# Patient Record
Sex: Female | Born: 1965 | Race: White | Hispanic: No | Marital: Married | Smoking: Current every day smoker
Health system: Southern US, Community
[De-identification: ages and names within clinical notes are randomized; demographics above are authoritative.]

## PROBLEM LIST (undated history)

## (undated) DIAGNOSIS — T7840XA Allergy, unspecified, initial encounter: Secondary | ICD-10-CM

## (undated) DIAGNOSIS — R7303 Prediabetes: Secondary | ICD-10-CM

## (undated) DIAGNOSIS — M1990 Unspecified osteoarthritis, unspecified site: Secondary | ICD-10-CM

## (undated) HISTORY — DX: Unspecified osteoarthritis, unspecified site: M19.90

## (undated) HISTORY — DX: Prediabetes: R73.03

## (undated) HISTORY — DX: Allergy, unspecified, initial encounter: T78.40XA

---
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
## 1998-04-20 ENCOUNTER — Other Ambulatory Visit: Admission: RE | Admit: 1998-04-20 | Discharge: 1998-04-20 | Payer: Self-pay | Admitting: Obstetrics and Gynecology

## 1999-05-05 ENCOUNTER — Other Ambulatory Visit: Admission: RE | Admit: 1999-05-05 | Discharge: 1999-05-05 | Payer: Self-pay | Admitting: Obstetrics and Gynecology

## 2000-05-03 ENCOUNTER — Other Ambulatory Visit: Admission: RE | Admit: 2000-05-03 | Discharge: 2000-05-03 | Payer: Self-pay | Admitting: Obstetrics and Gynecology

## 2000-06-01 ENCOUNTER — Ambulatory Visit (HOSPITAL_COMMUNITY): Admission: RE | Admit: 2000-06-01 | Discharge: 2000-06-01 | Payer: Self-pay | Admitting: Obstetrics and Gynecology

## 2001-05-30 ENCOUNTER — Other Ambulatory Visit: Admission: RE | Admit: 2001-05-30 | Discharge: 2001-05-30 | Payer: Self-pay | Admitting: Obstetrics and Gynecology

## 2002-06-30 ENCOUNTER — Other Ambulatory Visit: Admission: RE | Admit: 2002-06-30 | Discharge: 2002-06-30 | Payer: Self-pay | Admitting: Obstetrics and Gynecology

## 2003-07-10 ENCOUNTER — Other Ambulatory Visit: Admission: RE | Admit: 2003-07-10 | Discharge: 2003-07-10 | Payer: Self-pay | Admitting: Obstetrics and Gynecology

## 2004-10-27 ENCOUNTER — Other Ambulatory Visit: Admission: RE | Admit: 2004-10-27 | Discharge: 2004-10-27 | Payer: Self-pay | Admitting: Obstetrics and Gynecology

## 2005-01-17 ENCOUNTER — Encounter: Admission: RE | Admit: 2005-01-17 | Discharge: 2005-01-17 | Payer: Self-pay | Admitting: Obstetrics and Gynecology

## 2005-02-20 ENCOUNTER — Encounter: Admission: RE | Admit: 2005-02-20 | Discharge: 2005-02-20 | Payer: Self-pay | Admitting: Obstetrics and Gynecology

## 2005-11-10 ENCOUNTER — Other Ambulatory Visit: Admission: RE | Admit: 2005-11-10 | Discharge: 2005-11-10 | Payer: Self-pay | Admitting: Obstetrics and Gynecology

## 2007-11-04 ENCOUNTER — Encounter: Admission: RE | Admit: 2007-11-04 | Discharge: 2007-11-04 | Payer: Self-pay | Admitting: Otolaryngology

## 2007-12-18 ENCOUNTER — Encounter: Admission: RE | Admit: 2007-12-18 | Discharge: 2007-12-18 | Payer: Self-pay | Admitting: Obstetrics and Gynecology

## 2008-12-22 ENCOUNTER — Encounter: Admission: RE | Admit: 2008-12-22 | Discharge: 2008-12-22 | Payer: Self-pay | Admitting: Obstetrics and Gynecology

## 2009-12-23 ENCOUNTER — Encounter: Admission: RE | Admit: 2009-12-23 | Discharge: 2009-12-23 | Payer: Self-pay | Admitting: Obstetrics and Gynecology

## 2009-12-28 ENCOUNTER — Encounter: Admission: RE | Admit: 2009-12-28 | Discharge: 2009-12-28 | Payer: Self-pay | Admitting: Obstetrics and Gynecology

## 2010-10-30 ENCOUNTER — Encounter: Payer: Self-pay | Admitting: Obstetrics and Gynecology

## 2010-12-12 ENCOUNTER — Other Ambulatory Visit: Payer: Self-pay | Admitting: Obstetrics and Gynecology

## 2010-12-12 DIAGNOSIS — Z1231 Encounter for screening mammogram for malignant neoplasm of breast: Secondary | ICD-10-CM

## 2011-01-02 ENCOUNTER — Ambulatory Visit
Admission: RE | Admit: 2011-01-02 | Discharge: 2011-01-02 | Disposition: A | Discharge status: Home / Self Care | Payer: BC Managed Care – PPO | Source: Ambulatory Visit | Attending: Obstetrics and Gynecology | Admitting: Obstetrics and Gynecology

## 2011-01-02 DIAGNOSIS — Z1231 Encounter for screening mammogram for malignant neoplasm of breast: Secondary | ICD-10-CM

## 2011-02-24 NOTE — H&P (Signed)
[facility]  Patient:    Casey Barton, Casey Barton                       MRN: [id number] Adm. Date:  06/01/00 Attending:  Duke Salvia. Marcelle Overlie, M.D.                         History and Physical  CHIEF COMPLAINT:                  Request for permanent sterilization.  HISTORY OF PRESENT ILLNESS:       The patient is a 45 year old G1 P1, currently on Levlite, previously on Depo-Provera, presenting for permanent sterilization.  This procedure including permanence, failure rate of 2-12/998, and other risks regarding bleeding, infection, adjacent organ injury, possible need for open or additional surgery were all discussed with her, which she understands and accepts.  ALLERGIES:                        No known drug allergies.  PAST SURGICAL HISTORY:            None.  REVIEW OF SYSTEMS:                She had a fractured knee.  One vaginal delivery in 1990, a boy.  SOCIAL HISTORY:                   She is still smoking 1/2 pack of cigarettes per day.  PHYSICAL EXAMINATION:  VITAL SIGNS:                      Temperature 98.2 degrees, blood pressure 120/82.  HEENT:                            Unremarkable.  NECK:                             Supple without masses.  LUNGS:                            Clear.  CARDIOVASCULAR:                   Regular rate and rhythm without murmurs, rubs, or gallops.  BREAST:                           Without masses.  ABDOMEN:                          Soft, flat, nontender.  PELVIC:                           Vulva, vagina, and cervix normal.  Uterus mid position, nontender.  Adnexa negative.  EXTREMITIES:                      Negative.  NEUROLOGIC:                       Unremarkable.  LABORATORY DATA:                  Last Pap smear in July 2001 was class  1.  IMPRESSION:                       Normal examination; patient requests permanent sterilization.  PLAN:                             Laparoscopic tubal by Filshie  clip application.  The procedure and risks were discussed as noted above. DD: 05/29/00 TD:  05/30/00 Job: [id number]

## 2011-02-24 NOTE — Op Note (Signed)
[facility]  Patient:    Casey Barton, Casey Barton                       MRN: [id number] Proc. Date: 06/01/00 Attending:  Duke Salvia. Marcelle Overlie, M.D.                           Operative Report  PREOPERATIVE DIAGNOSIS:       Requests permanent sterilization.  POSTOPERATIVE DIAGNOSIS:      1. Requests permanent sterilization.                               2. 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  OPERATIONS:                   1. Diagnostic laparoscopy.                               2. Bilateral tubal ligation by Filshie clip                                  application.  SURGEON:                      Duke Salvia. Marcelle Overlie, M.D.  ANESTHESIA:                   General endotracheal.  COMPLICATIONS:                None.  DRAINS:                       In and out Foley catheter.  ESTIMATED BLOOD LOSS:         Less than 5 cc.  FINDINGS AND PROCEDURE:       The patient was brought to the operating room. After an adequate level of general endotracheal anesthesia was obtained and both legs in the stirrups the abdomen, perineum and vagina were prepped and draped in the usual manner for laparoscopy.  The bladder was drained.  EUA was carried.  Uterus was mid to slightly posterior and normal size. Adnexa negative.  Hulka tenaculum was positioned.  Attention was directed to the abdomen where a 2 cm subumbilical incision was made.  The Veress needle was introduced without difficulty.  Its intra-abdominal position was identified by pressure and water testing.  Prior to this the subumbilical area had been infiltrated with half percent Marcaine plain.  After a 2-liter pneumoperitoneum was then carried out the laparoscopic trocar and sheath were then introduced.  There was no evidence of any bleeding or trauma.  The patient was then placed in Trendelenburg and with the uterus anteflexed the pelvic findings were as follows.  Right  tube and ovary were normal.  The cul-de-sac was clear, except for where the left ovary was densely adherent to the area of the left uterosacral ligament and slightly lateral.  These were extremely dense adhesions and could not be lysed, but would require an open surgical technique in my opinion.  The left  tube, however, appeared to be normal and was not involved in the adherence of the left ovary.  No obvious areas of endometriosis.  The appendix and anterior cul-de-sac were otherwise normal as was the uterine serosa.  After these findings were noted half percent Marcaine plain was then dripped across the tube from the cornu to fimbriated end on each side.  Starting on the left the left tube was identified to the fimbriated end.  Once positive identification was made it was regrasped 2 cm from the cornu at a right angle and a Filshie clip was applied completely engulfing the tube with excellent application.  The same was repeated on the right side after carefully identifying the tube.  These areas were hemostatic prior to closure.  Sponge, needle and instrument counts were correct x 2. The incision was closed with a 3-0 Dexon subcuticular suture. She tolerated this well and went to recovery room in good condition. DD:  06/01/00 TD:  06/03/00 Job: [id number]

## 2011-06-13 ENCOUNTER — Other Ambulatory Visit: Payer: Self-pay | Admitting: Obstetrics and Gynecology

## 2011-06-13 DIAGNOSIS — N6320 Unspecified lump in the left breast, unspecified quadrant: Secondary | ICD-10-CM

## 2011-06-16 ENCOUNTER — Other Ambulatory Visit: Payer: BC Managed Care – PPO

## 2011-06-16 ENCOUNTER — Ambulatory Visit
Admission: RE | Admit: 2011-06-16 | Discharge: 2011-06-16 | Disposition: A | Discharge status: Home / Self Care | Payer: BC Managed Care – PPO | Source: Ambulatory Visit | Attending: Obstetrics and Gynecology | Admitting: Obstetrics and Gynecology

## 2011-06-16 ENCOUNTER — Other Ambulatory Visit: Payer: Self-pay | Admitting: Obstetrics and Gynecology

## 2011-06-16 DIAGNOSIS — N6320 Unspecified lump in the left breast, unspecified quadrant: Secondary | ICD-10-CM

## 2012-06-03 ENCOUNTER — Other Ambulatory Visit: Payer: Self-pay | Admitting: Obstetrics and Gynecology

## 2012-06-03 DIAGNOSIS — Z1231 Encounter for screening mammogram for malignant neoplasm of breast: Secondary | ICD-10-CM

## 2012-06-24 ENCOUNTER — Ambulatory Visit: Payer: BC Managed Care – PPO

## 2012-11-13 ENCOUNTER — Other Ambulatory Visit: Payer: Self-pay | Admitting: Family Medicine

## 2012-11-13 ENCOUNTER — Other Ambulatory Visit (HOSPITAL_COMMUNITY)
Admission: RE | Admit: 2012-11-13 | Discharge: 2012-11-13 | Disposition: A | Discharge status: Home / Self Care | Payer: BC Managed Care – PPO | Source: Ambulatory Visit | Attending: Family Medicine | Admitting: Family Medicine

## 2012-11-13 DIAGNOSIS — Z124 Encounter for screening for malignant neoplasm of cervix: Secondary | ICD-10-CM | POA: Insufficient documentation

## 2013-02-24 ENCOUNTER — Ambulatory Visit
Admission: RE | Admit: 2013-02-24 | Discharge: 2013-02-24 | Disposition: A | Discharge status: Home / Self Care | Payer: BC Managed Care – PPO | Source: Ambulatory Visit | Attending: Obstetrics and Gynecology | Admitting: Obstetrics and Gynecology

## 2013-02-24 DIAGNOSIS — Z1231 Encounter for screening mammogram for malignant neoplasm of breast: Secondary | ICD-10-CM

## 2013-04-16 ENCOUNTER — Ambulatory Visit (INDEPENDENT_AMBULATORY_CARE_PROVIDER_SITE_OTHER): Payer: PRIVATE HEALTH INSURANCE | Admitting: Family Medicine

## 2013-04-16 VITALS — BP 130/90 | HR 85 | Temp 98.0°F | Resp 16 | Ht 65.25 in | Wt 187.8 lb

## 2013-04-16 DIAGNOSIS — L03119 Cellulitis of unspecified part of limb: Secondary | ICD-10-CM

## 2013-04-16 DIAGNOSIS — IMO0002 Reserved for concepts with insufficient information to code with codable children: Secondary | ICD-10-CM

## 2013-04-16 MED ORDER — CEPHALEXIN 500 MG PO CAPS: 1000.0000 mg | ORAL_CAPSULE | Freq: Two times a day (BID) | ORAL | Status: DC

## 2013-04-16 NOTE — Progress Notes (Signed)
[facility] [address], [place] [address] [phone]  Date:  04/16/2013   Name:  Casey Barton   DOB:  [birth year]   MRN:  [id number]  PCP:  Gaye Alken, MD    Chief Complaint: left arm rash, painful x 5 days   History of Present Illness:  Casey Barton is a 46 y.o. very pleasant female patient who presents with the following:  She has noted a rash on her left arm for the last 5 days or so.  It does not seem to be getting worse.  The rash is tender, not itchy.   She does not have any other rashes.   She did have a breast bx 2 years ago, but this was benign.    She also notes some build-up in her ears.  They itch, and her hearing is not always good.    No fever or chills, she feels ok otherwise.  No other concerns today  There are no active problems to display for this patient.   Past Medical History  Diagnosis Date  . Allergy   . Arthritis     History reviewed. No pertinent past surgical history.  History  Substance Use Topics  . Smoking status: Current Every Day Smoker  . Smokeless tobacco: Not on file  . Alcohol Use: Yes    Family History  Problem Relation Age of Onset  . Heart disease Father     Not on File  Medication list has been reviewed and updated.  No current outpatient prescriptions on file prior to visit.   No current facility-administered medications on file prior to visit.    Review of Systems:  As per HPI- otherwise negative.   Physical Examination: Filed Vitals:   04/16/13 0833  BP: 100/62  Pulse: 85  Temp: 98 F (36.7 C)  Resp: 16   Filed Vitals:   04/16/13 0833  Height: 5' 5.25" (1.657 m)  Weight: 187 lb 12.8 oz (85.186 kg)   Body mass index is 31.03 kg/(m^2). Ideal Body Weight: Weight in (lb) to have BMI = 25: 151.1  GEN: WDWN, NAD, Non-toxic, A & O x 3, looks well HEENT: Atraumatic, Normocephalic. Neck supple. No masses, No LAD.  Bilateral TM wnl, oropharynx normal.   PEERL,EOMI.  Some dry skin in outer ear canals, ears otherwise normal  Ears and Nose: No external deformity. CV: RRR, No M/G/R. No JVD. No thrill. No extra heart sounds. PULM: CTA B, no wheezes, crackles, rhonchi. No retractions. No resp. distress. No accessory muscle use. ABD: S, NT, ND, +BS. No rebound. No HSM. EXTR: No c/c/e NEURO Normal gait.  PSYCH: Normally interactive. Conversant. Not depressed or anxious appearing.  Calm demeanor.  Skin: there is a small scratch with surrounding redness just distal to her elbow on the dorsal forearm.  This area is tender  The forarm near the wrist displays a flat, slightly red, scattered rash The elbow and wrist joints are negative with full, painless ROM.  Hand with normal strength and cap refill   Assessment and Plan: Cellulitis of elbow - Plan: cephALEXin (KEFLEX) 500 MG capsule  Possible cellulitis of soft tissue near elbow.  Treat with keflex.  Close follow-up if not better- Sooner if worse.     Signed Abbe Amsterdam, MD

## 2013-04-16 NOTE — Patient Instructions (Addendum)
Use the antibiotic as directed.  If you are not better in the next few days please let me know- Sooner if worse.

## 2013-05-20 ENCOUNTER — Ambulatory Visit (INDEPENDENT_AMBULATORY_CARE_PROVIDER_SITE_OTHER): Payer: PRIVATE HEALTH INSURANCE | Admitting: Emergency Medicine

## 2013-05-20 VITALS — BP 108/74 | HR 80 | Temp 98.1°F | Resp 16 | Ht 65.5 in | Wt 189.0 lb

## 2013-05-20 DIAGNOSIS — L259 Unspecified contact dermatitis, unspecified cause: Secondary | ICD-10-CM

## 2013-05-20 DIAGNOSIS — L309 Dermatitis, unspecified: Secondary | ICD-10-CM

## 2013-05-20 DIAGNOSIS — H60332 Swimmer's ear, left ear: Secondary | ICD-10-CM

## 2013-05-20 DIAGNOSIS — H60399 Other infective otitis externa, unspecified ear: Secondary | ICD-10-CM

## 2013-05-20 MED ORDER — CIPROFLOXACIN-HYDROCORTISONE 0.2-1 % OT SUSP: 3.0000 [drp] | Freq: Two times a day (BID) | OTIC | Status: DC

## 2013-05-20 MED ORDER — TRIAMCINOLONE ACETONIDE 0.1 % EX CREA: TOPICAL_CREAM | Freq: Two times a day (BID) | CUTANEOUS | Status: DC

## 2013-05-20 NOTE — Patient Instructions (Addendum)

## 2013-05-20 NOTE — Progress Notes (Signed)
[facility] [address], [place] [address] [phone]  Date:  05/20/2013   Name:  Casey Barton   DOB:  June 08, 1966   MRN:  [id number]  PCP:  Gaye Alken, MD    Chief Complaint: Otalgia and Rash   History of Present Illness:  Casey Barton is a 47 y.o. very pleasant female patient who presents with the following:  Has multiple concerns.  Was treated with keflex for cellulitis and has improved.  Has continued erythematous rash on both forearms that has not improved.  Has put benadryl on it with no change. Has itching in ears after using qtips.  No drainage or pain.  No improvement with over the counter medications or other home remedies. Denies other complaint or health concern today.   There are no active problems to display for this patient.   Past Medical History  Diagnosis Date  . Allergy   . Arthritis     History reviewed. No pertinent past surgical history.  History  Substance Use Topics  . Smoking status: Current Every Day Smoker  . Smokeless tobacco: Not on file  . Alcohol Use: Yes    Family History  Problem Relation Age of Onset  . Heart disease Father     Allergies  Allergen Reactions  . Sulfa Antibiotics     SOB    Medication list has been reviewed and updated.  Current Outpatient Prescriptions on File Prior to Visit  Medication Sig Dispense Refill  . cephALEXin (KEFLEX) 500 MG capsule Take 2 capsules (1,000 mg total) by mouth 2 (two) times daily.  28 capsule  0   No current facility-administered medications on file prior to visit.    Review of Systems:  As per HPI, otherwise negative.    Physical Examination: Filed Vitals:   05/20/13 0842  BP: 108/74  Pulse: 80  Temp: 98.1 F (36.7 C)  Resp: 16   Filed Vitals:   05/20/13 0842  Height: 5' 5.5" (1.664 m)  Weight: 189 lb (85.73 kg)   Body mass index is 30.96 kg/(m^2). Ideal Body Weight: Weight in (lb) to have BMI = 25: 152.2   GEN:  WDWN, NAD, Non-toxic, Alert & Oriented x 3 HEENT: Atraumatic, Normocephalic.  Ears and Nose: No external deformity.  Erythematous external ear canal on left. EXTR: No clubbing/cyanosis/edema NEURO: Normal gait.  PSYCH: Normally interactive. Conversant. Not depressed or anxious appearing.  Calm demeanor.  SKIN:  Eczema forearms.  Assessment and Plan: Eczema External otitis Resolved cellulitis TAC cipro hc  Signed,  Phillips Odor, MD

## 2013-08-01 ENCOUNTER — Ambulatory Visit: Payer: PRIVATE HEALTH INSURANCE | Admitting: Family Medicine

## 2014-01-01 ENCOUNTER — Ambulatory Visit (INDEPENDENT_AMBULATORY_CARE_PROVIDER_SITE_OTHER): Payer: PRIVATE HEALTH INSURANCE | Admitting: Physician Assistant

## 2014-01-01 VITALS — BP 120/78 | HR 83 | Temp 98.1°F | Resp 18 | Ht 66.5 in | Wt 192.0 lb

## 2014-01-01 DIAGNOSIS — J309 Allergic rhinitis, unspecified: Secondary | ICD-10-CM

## 2014-01-01 DIAGNOSIS — R51 Headache: Secondary | ICD-10-CM

## 2014-01-01 DIAGNOSIS — J069 Acute upper respiratory infection, unspecified: Secondary | ICD-10-CM

## 2014-01-01 DIAGNOSIS — M542 Cervicalgia: Secondary | ICD-10-CM

## 2014-01-01 DIAGNOSIS — B9789 Other viral agents as the cause of diseases classified elsewhere: Secondary | ICD-10-CM

## 2014-01-01 MED ORDER — GUAIFENESIN ER 1200 MG PO TB12: 1.0000 | ORAL_TABLET | Freq: Two times a day (BID) | ORAL | Status: DC | PRN

## 2014-01-01 MED ORDER — PSEUDOEPHEDRINE HCL 60 MG PO TABS: 60.0000 mg | ORAL_TABLET | Freq: Four times a day (QID) | ORAL | Status: DC | PRN

## 2014-01-01 MED ORDER — FLUTICASONE PROPIONATE 50 MCG/ACT NA SUSP: 2.0000 | Freq: Every day | NASAL | Status: DC

## 2014-01-01 MED ORDER — KETOROLAC TROMETHAMINE 60 MG/2ML IM SOLN
60.0000 mg | Freq: Once | INTRAMUSCULAR | Status: AC
  Administered 2014-01-01: 60 mg via INTRAMUSCULAR

## 2014-01-01 MED ORDER — BENZONATATE 100 MG PO CAPS: 100.0000 mg | ORAL_CAPSULE | Freq: Three times a day (TID) | ORAL | Status: DC | PRN

## 2014-01-01 MED ORDER — CYCLOBENZAPRINE HCL 10 MG PO TABS: 10.0000 mg | ORAL_TABLET | Freq: Three times a day (TID) | ORAL | Status: DC | PRN

## 2014-01-01 NOTE — Progress Notes (Signed)
Subjective:    Patient ID: Casey Barton, female    DOB: 01/03/1966, 48 y.o.   MRN: [id number]  HPI   Casey Barton is a pleasant 48 yr old female here with complain of 3 days of headache and head pressure.  Has pain at the base of her skull down to her upper back - worse on the left than on the right.  Throbbing pain.  Rates this pain as 10/10.  Started insidiously 3 days ago - no known injury.  She does not typically get headaches.  Pain has not been responsive to ibuprofen but is relieved when she goes to sleep at night.  Additionally she has sinus congestion, sneezing, non prod cough, wheezing and SOB. "Everything aches."  She has not had fever.  Has been taking otc sudafed without relief.  She does have seasonal allergies - took claritin for 2 days but did not seem to be helping.  No known sick contacts.  She is a current every day smoker - but is "barely" smoking due to illness   Review of Systems  Constitutional: Negative for fever and chills.  HENT: Positive for congestion, sinus pressure and sneezing. Negative for ear pain and sore throat.   Respiratory: Positive for cough, shortness of breath and wheezing.   Cardiovascular: Negative.   Gastrointestinal: Negative.   Musculoskeletal: Positive for neck pain. Negative for neck stiffness.  Skin: Negative.   Neurological: Positive for headaches.       Objective:   Physical Exam  Vitals reviewed. Constitutional: She is oriented to person, place, and time. She appears well-developed and well-nourished. No distress.  HENT:  Head: Normocephalic and atraumatic.  Right Ear: Tympanic membrane and ear canal normal.  Left Ear: Tympanic membrane and ear canal normal.  Nose: Right sinus exhibits maxillary sinus tenderness. Left sinus exhibits maxillary sinus tenderness.  Mouth/Throat: Uvula is midline, oropharynx is clear and moist and mucous membranes are normal.  Eyes: Conjunctivae are normal. No scleral icterus.  Neck: Neck supple.  Muscular tenderness present. No spinous process tenderness present. No rigidity. Decreased range of motion present.    Cardiovascular: Normal rate, regular rhythm and normal heart sounds.   Pulmonary/Chest: Effort normal and breath sounds normal. She has no wheezes. She has no rales.  Abdominal: Soft. There is no tenderness.  Lymphadenopathy:    She has no cervical adenopathy.  Neurological: She is alert and oriented to person, place, and time.  Skin: Skin is warm and dry.  Psychiatric: She has a normal mood and affect. Her behavior is normal.       Assessment & Plan:  Neck pain on right side - Plan: ketorolac (TORADOL) injection 60 mg, cyclobenzaprine (FLEXERIL) 10 MG tablet  Headache(784.0) - Plan: ketorolac (TORADOL) injection 60 mg  Viral URI with cough - Plan: benzonatate (TESSALON) 100 MG capsule, fluticasone (FLONASE) 50 MCG/ACT nasal spray, Guaifenesin (MUCINEX MAXIMUM STRENGTH) 1200 MG TB12, pseudoephedrine (SUDAFED) 60 MG tablet  Allergic rhinitis - Plan: fluticasone (FLONASE) 50 MCG/ACT nasal spray   Casey Barton is a pleasant 48 yr old female with 3 days of headache/neck pain.  On exam she very tender over right trap and levator scap - she has increased pain when rotating the neck to the right and with lateral flexion to the right.  IM toradol given in clinic with some relief of symptoms.  Suspect this neck pain/strain is causing her to have HA.  Will try Flexeril TID prn.  Also apply heat to area.  May  use Tylenol for pain relief.  May resume NSAIDs later today.  For URI symptoms will start Flonase, Tessalon, Mucinex, Sudafed ( for max of 3 days.)  Push fluids, rest.  If HA is worsening or not improving, pt to RTC    Casey Barton MHS, PA-C [facility] 3/26/20159:55 AM   )

## 2014-01-01 NOTE — Patient Instructions (Signed)
I think that your neck pain is due to muscle strain/spasm and that is leading to you having a headache.  We have given you injection of pain medicine.  You can continue taking ibuprofen (next dose in about 6 hours.)  You can take Tylenol before that if you need to.  Try the cyclobenzaprine (muscle relaxer) - this may make you sleepy though, so be careful with the first dose - you can use it every 8 hours if needed.  Also try using a heating pad on the right side of your neck.    If your headache is worsening or not getting better please let us know right away  Use the Tessalon Perles every 8 hours if needed for cough  Take the guaifenesin (Mucinex) twice daily to help break up congestion  Use the fluticasone (Flonase) 2 sprays each nostril once daily - this will help with congestion/inflammation in your nasal passages and sinuses.  This will also help with allergy symptoms so you can continue using this through allergy season  Use the pseudoephedrine (Sudafed) every 6hours if needed for a maximum of 3 days to help relieve congestion  Lots of water to drink and plenty of rest  If any symptoms are worsening or not improving, please let us know   Upper Respiratory Infection, Adult An upper respiratory infection (URI) is also sometimes known as the common cold. The upper respiratory tract includes the nose, sinuses, throat, trachea, and bronchi. Bronchi are the airways leading to the lungs. Most people improve within 1 week, but symptoms can last up to 2 weeks. A residual cough may last even longer.  CAUSES Many different viruses can infect the tissues lining the upper respiratory tract. The tissues become irritated and inflamed and often become very moist. Mucus production is also common. A cold is contagious. You can easily spread the virus to others by oral contact. This includes kissing, sharing a glass, coughing, or sneezing. Touching your mouth or nose and then touching a surface, which is then  touched by another person, can also spread the virus. SYMPTOMS  Symptoms typically develop 1 to 3 days after you come in contact with a cold virus. Symptoms vary from person to person. They may include:  Runny nose.  Sneezing.  Nasal congestion.  Sinus irritation.  Sore throat.  Loss of voice (laryngitis).  Cough.  Fatigue.  Muscle aches.  Loss of appetite.  Headache.  Low-grade fever. DIAGNOSIS  You might diagnose your own cold based on familiar symptoms, since most people get a cold 2 to 3 times a year. Your caregiver can confirm this based on your exam. Most importantly, your caregiver can check that your symptoms are not due to another disease such as strep throat, sinusitis, pneumonia, asthma, or epiglottitis. Blood tests, throat tests, and X-rays are not necessary to diagnose a common cold, but they may sometimes be helpful in excluding other more serious diseases. Your caregiver will decide if any further tests are required. RISKS AND COMPLICATIONS  You may be at risk for a more severe case of the common cold if you smoke cigarettes, have chronic heart disease (such as heart failure) or lung disease (such as asthma), or if you have a weakened immune system. The very young and very old are also at risk for more serious infections. Bacterial sinusitis, middle ear infections, and bacterial pneumonia can complicate the common cold. The common cold can worsen asthma and chronic obstructive pulmonary disease (COPD). Sometimes, these complications can require  emergency medical care and may be life-threatening. PREVENTION  The best way to protect against getting a cold is to practice good hygiene. Avoid oral or hand contact with people with cold symptoms. Wash your hands often if contact occurs. There is no clear evidence that vitamin C, vitamin E, echinacea, or exercise reduces the chance of developing a cold. However, it is always recommended to get plenty of rest and practice good  nutrition. TREATMENT  Treatment is directed at relieving symptoms. There is no cure. Antibiotics are not effective, because the infection is caused by a virus, not by bacteria. Treatment may include:  Increased fluid intake. Sports drinks offer valuable electrolytes, sugars, and fluids.  Breathing heated mist or steam (vaporizer or shower).  Eating chicken soup or other clear broths, and maintaining good nutrition.  Getting plenty of rest.  Using gargles or lozenges for comfort.  Controlling fevers with ibuprofen or acetaminophen as directed by your caregiver.  Increasing usage of your inhaler if you have asthma. Zinc gel and zinc lozenges, taken in the first 24 hours of the common cold, can shorten the duration and lessen the severity of symptoms. Pain medicines may help with fever, muscle aches, and throat pain. A variety of non-prescription medicines are available to treat congestion and runny nose. Your caregiver can make recommendations and may suggest nasal or lung inhalers for other symptoms.  HOME CARE INSTRUCTIONS   Only take over-the-counter or prescription medicines for pain, discomfort, or fever as directed by your caregiver.  Use a warm mist humidifier or inhale steam from a shower to increase air moisture. This may keep secretions moist and make it easier to breathe.  Drink enough water and fluids to keep your urine clear or pale yellow.  Rest as needed.  Return to work when your temperature has returned to normal or as your caregiver advises. You may need to stay home longer to avoid infecting others. You can also use a face mask and careful hand washing to prevent spread of the virus. SEEK MEDICAL CARE IF:   After the first few days, you feel you are getting worse rather than better.  You need your caregiver's advice about medicines to control symptoms.  You develop chills, worsening shortness of breath, or brown or red sputum. These may be signs of  pneumonia.  You develop yellow or brown nasal discharge or pain in the face, especially when you bend forward. These may be signs of sinusitis.  You develop a fever, swollen neck glands, pain with swallowing, or white areas in the back of your throat. These may be signs of strep throat. SEEK IMMEDIATE MEDICAL CARE IF:   You have a fever.  You develop severe or persistent headache, ear pain, sinus pain, or chest pain.  You develop wheezing, a prolonged cough, cough up blood, or have a change in your usual mucus (if you have chronic lung disease).  You develop sore muscles or a stiff neck. Document Released: 03/21/2001 Document Revised: 12/18/2011 Document Reviewed: 01/27/2011 [facility] Patient Information 2014 [employer], [place].

## 2014-02-17 ENCOUNTER — Other Ambulatory Visit: Payer: Self-pay

## 2014-02-17 DIAGNOSIS — Z1231 Encounter for screening mammogram for malignant neoplasm of breast: Secondary | ICD-10-CM

## 2014-02-26 ENCOUNTER — Ambulatory Visit
Admission: RE | Admit: 2014-02-26 | Discharge: 2014-02-26 | Disposition: A | Discharge status: Home / Self Care | Payer: No Typology Code available for payment source | Source: Ambulatory Visit

## 2014-02-26 ENCOUNTER — Ambulatory Visit: Payer: BC Managed Care – PPO

## 2014-02-26 DIAGNOSIS — Z1231 Encounter for screening mammogram for malignant neoplasm of breast: Secondary | ICD-10-CM

## 2014-11-04 ENCOUNTER — Emergency Department (HOSPITAL_COMMUNITY)
Admission: EM | Admit: 2014-11-04 | Discharge: 2014-11-04 | Disposition: A | Discharge status: Home / Self Care | Payer: 59 | Attending: Emergency Medicine | Admitting: Emergency Medicine

## 2014-11-04 ENCOUNTER — Emergency Department (HOSPITAL_COMMUNITY): Payer: 59

## 2014-11-04 ENCOUNTER — Encounter (HOSPITAL_COMMUNITY): Payer: Self-pay

## 2014-11-04 ENCOUNTER — Other Ambulatory Visit: Payer: Self-pay | Admitting: Obstetrics and Gynecology

## 2014-11-04 DIAGNOSIS — N644 Mastodynia: Secondary | ICD-10-CM

## 2014-11-04 DIAGNOSIS — R0789 Other chest pain: Secondary | ICD-10-CM | POA: Insufficient documentation

## 2014-11-04 DIAGNOSIS — M1990 Unspecified osteoarthritis, unspecified site: Secondary | ICD-10-CM | POA: Insufficient documentation

## 2014-11-04 DIAGNOSIS — Z720 Tobacco use: Secondary | ICD-10-CM | POA: Insufficient documentation

## 2014-11-04 DIAGNOSIS — Z7951 Long term (current) use of inhaled steroids: Secondary | ICD-10-CM | POA: Diagnosis not present

## 2014-11-04 DIAGNOSIS — Z79899 Other long term (current) drug therapy: Secondary | ICD-10-CM | POA: Diagnosis not present

## 2014-11-04 DIAGNOSIS — R079 Chest pain, unspecified: Secondary | ICD-10-CM

## 2014-11-04 DIAGNOSIS — M79602 Pain in left arm: Secondary | ICD-10-CM | POA: Diagnosis present

## 2014-11-04 LAB — CBC
HEMATOCRIT: 45.8 % (ref 36.0–46.0)
Hemoglobin: 16.2 g/dL — ABNORMAL HIGH (ref 12.0–15.0)
MCH: 32.1 pg (ref 26.0–34.0)
MCHC: 35.4 g/dL (ref 30.0–36.0)
MCV: 90.9 fL (ref 78.0–100.0)
Platelets: 204 10*3/uL (ref 150–400)
RBC: 5.04 MIL/uL (ref 3.87–5.11)
RDW: 12.6 % (ref 11.5–15.5)
WBC: 11 10*3/uL — AB (ref 4.0–10.5)

## 2014-11-04 LAB — BASIC METABOLIC PANEL
Anion gap: 9 (ref 5–15)
BUN: 16 mg/dL (ref 6–23)
CO2: 25 mmol/L (ref 19–32)
Calcium: 9.4 mg/dL (ref 8.4–10.5)
Chloride: 105 mmol/L (ref 96–112)
Creatinine, Ser: 0.79 mg/dL (ref 0.50–1.10)
GFR calc non Af Amer: >90 mL/min (ref >90)
Glucose, Bld: 108 mg/dL — ABNORMAL HIGH (ref 70–99)
Potassium: 4.2 mmol/L (ref 3.5–5.1)
SODIUM: 139 mmol/L (ref 135–145)

## 2014-11-04 LAB — I-STAT TROPONIN, ED
TROPONIN I, POC: 0 ng/mL (ref 0.00–0.08)
Troponin i, poc: 0 ng/mL (ref 0.00–0.08)

## 2014-11-04 MED ORDER — HYDROCODONE-ACETAMINOPHEN 5-325 MG PO TABS: 1.0000 | ORAL_TABLET | Freq: Four times a day (QID) | ORAL | Status: DC | PRN

## 2014-11-04 NOTE — ED Notes (Signed)
Family at bedside. 

## 2014-11-04 NOTE — ED Notes (Signed)
Pt from home with left arm pain with radiation to left chest.  Pt sts it feels like a pulled muscle.  Was seen and evaluated by her PCP today and was told to come to ER for evaluation of chest pain.

## 2014-11-04 NOTE — ED Provider Notes (Signed)
CSN: [id number]     Arrival date & time 11/04/14  1548 History   First MD Initiated Contact with Patient 11/04/14 1623     Chief Complaint  Patient presents with  . Arm Pain     (Consider location/radiation/quality/duration/timing/severity/associated sxs/prior Treatment) Patient is a 49 y.o. female presenting with arm pain and chest pain. The history is provided by the patient.  Arm Pain Associated symptoms include chest pain. Pertinent negatives include no abdominal pain, no headaches and no shortness of breath.  Chest Pain Pain location:  L chest Pain quality: aching   Pain quality comment:  Associates it to msk pain or possibly gerd Pain radiates to:  L arm Pain radiates to the back: yes   Pain severity:  Mild Onset quality:  Gradual Duration:  3 days Timing:  Intermittent Progression:  Unchanged Chronicity:  New Context: at rest   Relieved by:  Nothing Worsened by:  Nothing tried Ineffective treatments:  None tried Associated symptoms: no abdominal pain, no back pain, no cough, no dizziness, no fatigue, no fever, no headache, no nausea, no shortness of breath and not vomiting     Past Medical History  Diagnosis Date  . Allergy   . Arthritis    History reviewed. No pertinent past surgical history. Family History  Problem Relation Age of Onset  . Heart disease Father    History  Substance Use Topics  . Smoking status: Current Every Day Smoker  . Smokeless tobacco: Not on file  . Alcohol Use: Yes   OB History    No data available     Review of Systems  Constitutional: Negative for fever and fatigue.  HENT: Negative for congestion and drooling.   Eyes: Negative for pain.  Respiratory: Negative for cough and shortness of breath.   Cardiovascular: Positive for chest pain.  Gastrointestinal: Negative for nausea, vomiting, abdominal pain and diarrhea.  Genitourinary: Negative for dysuria and hematuria.  Musculoskeletal: Negative for back pain, gait problem and  neck pain.  Skin: Negative for color change.  Neurological: Negative for dizziness and headaches.  Hematological: Negative for adenopathy.  Psychiatric/Behavioral: Negative for behavioral problems.  All other systems reviewed and are negative.     Allergies  Sulfa antibiotics  Home Medications   Prior to Admission medications   Medication Sig Start Date End Date Taking? Authorizing Provider  benzonatate (TESSALON) 100 MG capsule Take 1-2 capsules (100-200 mg total) by mouth 3 (three) times daily as needed for cough. 01/01/14   Eleanore Delia Chimes, PA-C  cyclobenzaprine (FLEXERIL) 10 MG tablet Take 1 tablet (10 mg total) by mouth 3 (three) times daily as needed for muscle spasms. 01/01/14   Eleanore Delia Chimes, PA-C  fluticasone (FLONASE) 50 MCG/ACT nasal spray Place 2 sprays into both nostrils daily. 01/01/14   Eleanore Delia Chimes, PA-C  Guaifenesin (MUCINEX MAXIMUM STRENGTH) 1200 MG TB12 Take 1 tablet (1,200 mg total) by mouth every 12 (twelve) hours as needed. 01/01/14   Eleanore Delia Chimes, PA-C  ibuprofen (ADVIL,MOTRIN) 800 MG tablet Take 800 mg by mouth every 8 (eight) hours as needed.    Historical Provider, MD  pseudoephedrine (SUDAFED) 60 MG tablet Take 1 tablet (60 mg total) by mouth every 6 (six) hours as needed for congestion. 01/01/14   Eleanore E Egan, PA-C   BP 175/81 mmHg  Pulse 85  Temp(Src) 99.1 F (37.3 C) (Oral)  Resp 18  Ht  (1.676 m)  Wt 200 lb (90.719 kg)  BMI 32.30 kg/m2  SpO2  100%  LMP 03/22/2013 Physical Exam  Constitutional: She is oriented to person, place, and time. She appears well-developed and well-nourished.  HENT:  Head: Normocephalic.  Mouth/Throat: Oropharynx is clear and moist. No oropharyngeal exudate.  Eyes: Conjunctivae and EOM are normal. Pupils are equal, round, and reactive to light.  Neck: Normal range of motion. Neck supple.  Cardiovascular: Normal rate, regular rhythm, normal heart sounds and intact distal pulses.  Exam reveals no gallop and no  friction rub.   No murmur heard. Pulmonary/Chest: Effort normal and breath sounds normal. No respiratory distress. She has no wheezes. She exhibits no tenderness.  Abdominal: Soft. Bowel sounds are normal. There is no tenderness. There is no rebound and no guarding.  Musculoskeletal: Normal range of motion. She exhibits no edema or tenderness.  Pain not reproducible with range of motion of the left arm.  2+ distal pulses in bilateral upper extremities.  Neurological: She is alert and oriented to person, place, and time.  Skin: Skin is warm and dry.  Psychiatric: She has a normal mood and affect. Her behavior is normal.  Nursing note and vitals reviewed.   ED Course  Procedures (including critical care time) Labs Review Labs Reviewed  CBC - Abnormal; Notable for the following:    WBC 11.0 (*)    Hemoglobin 16.2 (*)    All other components within normal limits  BASIC METABOLIC PANEL - Abnormal; Notable for the following:    Glucose, Bld 108 (*)    All other components within normal limits  I-STAT TROPOININ, ED  Rosezena SensorI-STAT TROPOININ, ED    Imaging Review Dg Chest 2 View  11/04/2014   CLINICAL DATA:  Intermittent pain this starts in the left axilla.  EXAM: CHEST  2 VIEW  COMPARISON:  None.  FINDINGS: The heart size and mediastinal contours are within normal limits. Both lungs are clear. The visualized skeletal structures are unremarkable.  IMPRESSION: No active cardiopulmonary disease.   Electronically Signed   By: Elige KoHetal  Patel   On: 11/04/2014 18:08     EKG Interpretation   Date/Time:  Wednesday November 04 2014 15:57:02 EST Ventricular Rate:  96 PR Interval:  150 QRS Duration: 78 QT Interval:  352 QTC Calculation: 444 R Axis:   70 Text Interpretation:  Normal sinus rhythm Normal ECG Confirmed by Kaisei Gilbo   MD, Meriah Shands ([id number]) on 11/04/2014 4:36:23 PM      MDM   Final diagnoses:  Chest pain    4:54 PM 49 y.o. female with a family history of heart disease, tobacco abuse  who presents with left-sided chest pain which began 2-3 days ago. She states that she's been having intermittent aching type pains in her left chest left upper arm and left upper back. She notes that her symptoms last about a minute and then seemed to resolve. She's had several episodes per day. They do not seem to be related to exertion. Her vital signs are unremarkable here. She presented to her PCP today and saw one of the physician assistants who recommended she come to the ER for evaluation. Last episode of chest pain was about 1.5 hours ago. She has been asymptomatic since then. Chest pain is atypical and she has 2 risk factors for heart disease. We'll get screening labs and imaging.  8:11 PM: Pt remains asx on exam. I interpreted/reviewed the labs and/or imaging which were non-contributory.  Delta troponin is negative. Low risk for MACE PER HEART PATHWAY. I have discussed the diagnosis/risks/treatment options with the patient and  believe the pt to be eligible for discharge home to follow-up with her pcp tomorrow to arrange further cardiac workup. We also discussed returning to the ED immediately if new or worsening sx occur. We discussed the sx which are most concerning (e.g., worsening cp, sob) that necessitate immediate return. Medications administered to the patient during their visit and any new prescriptions provided to the patient are listed below.  Medications given during this visit Medications - No data to display  New Prescriptions   HYDROCODONE-ACETAMINOPHEN (NORCO) 5-325 MG PER TABLET    Take 1 tablet by mouth every 6 (six) hours as needed for moderate pain.     Purvis Sheffield, MD 11/04/14 2012

## 2014-11-04 NOTE — ED Notes (Signed)
Patient is alert and orientedx4.  Patient was explained discharge instructions and they understood them with no questions.  The patient's friend, Dionicia AblerLeigh Lloyd is taking the patient home.

## 2014-11-04 NOTE — Discharge Instructions (Signed)

## 2015-04-22 ENCOUNTER — Other Ambulatory Visit: Payer: Self-pay

## 2015-04-22 DIAGNOSIS — Z1231 Encounter for screening mammogram for malignant neoplasm of breast: Secondary | ICD-10-CM

## 2015-05-19 ENCOUNTER — Ambulatory Visit: Payer: No Typology Code available for payment source

## 2015-06-09 ENCOUNTER — Ambulatory Visit (INDEPENDENT_AMBULATORY_CARE_PROVIDER_SITE_OTHER): Payer: Managed Care, Other (non HMO) | Admitting: Emergency Medicine

## 2015-06-09 VITALS — BP 122/80 | HR 96 | Temp 98.7°F | Resp 16 | Ht 66.0 in | Wt 203.0 lb

## 2015-06-09 DIAGNOSIS — B9789 Other viral agents as the cause of diseases classified elsewhere: Principal | ICD-10-CM

## 2015-06-09 DIAGNOSIS — J069 Acute upper respiratory infection, unspecified: Secondary | ICD-10-CM

## 2015-06-09 MED ORDER — AZELASTINE HCL 0.1 % NA SOLN: 2.0000 | Freq: Two times a day (BID) | NASAL | Status: DC

## 2015-06-09 MED ORDER — BENZONATATE 100 MG PO CAPS: 100.0000 mg | ORAL_CAPSULE | Freq: Three times a day (TID) | ORAL | Status: DC | PRN

## 2015-06-09 MED ORDER — AZITHROMYCIN 250 MG PO TABS: ORAL_TABLET | ORAL | Status: DC

## 2015-06-09 NOTE — Progress Notes (Signed)
Subjective:  This chart was scribed for Lesle Chris MD,by Hatice Demirci,scribe, at [facility].  This patient was seen in room 3 and the patient's care was started at 1:04 PM.   Chief Complaint  Patient presents with  . Nasal Congestion    x 2 days  . Cough  . Headache  . Sore Throat     Patient ID: Casey Barton, female    DOB: [birth year], 49 y.o.   MRN: [id number]  HPI  HPI Comments: Casey Barton is a 49 y.o. female who presents to the [facility] complaining of nasal congestion (clear) onset three days ago followed by associated symptoms of a non productive cough/wheezing, headache, sore throat and itchy eyes.  Patient feels that it is unusual for her to be sick this time of year as her allergies usually flare up in October.  Patient was in the hospital for three days and thinks that she may have gotten something there.  Patient smokes but states that she has not been smoking since her symptoms started.  She has been using Nasacort as well as Advil but denies any relief.  She is not using any medications currently.  Patient was not able to go to work today and would like a work note.      There are no active problems to display for this patient.  Past Medical History  Diagnosis Date  . Allergy   . Arthritis    History reviewed. No pertinent past surgical history. Allergies  Allergen Reactions  . Sulfa Antibiotics     SOB   Prior to Admission medications   Medication Sig Start Date End Date Taking? Authorizing Provider  benzonatate (TESSALON) 100 MG capsule Take 1-2 capsules (100-200 mg total) by mouth 3 (three) times daily as needed for cough. Patient not taking: Reported on 11/04/2014 01/01/14   Godfrey Pick, PA-C  cyclobenzaprine (FLEXERIL) 10 MG tablet Take 1 tablet (10 mg total) by mouth 3 (three) times daily as needed for muscle spasms. Patient not taking: Reported on 11/04/2014 01/01/14   Godfrey Pick, PA-C  fluticasone  [facility]) 50 MCG/ACT nasal spray Place 2 sprays into both nostrils daily. Patient not taking: Reported on 11/04/2014 01/01/14   Godfrey Pick, PA-C  Guaifenesin [facility] MAXIMUM STRENGTH) 1200 MG TB12 Take 1 tablet (1,200 mg total) by mouth every 12 (twelve) hours as needed. Patient not taking: Reported on 11/04/2014 01/01/14   Godfrey Pick, PA-C  HYDROcodone-acetaminophen (NORCO) 5-325 MG per tablet Take 1 tablet by mouth every 6 (six) hours as needed for moderate pain. Patient not taking: Reported on 06/09/2015 11/04/14   Purvis Sheffield, MD  ibuprofen (ADVIL,MOTRIN) 800 MG tablet Take 800 mg by mouth every 8 (eight) hours as needed for mild pain.     Historical Provider, MD  pseudoephedrine (SUDAFED) 60 MG tablet Take 1 tablet (60 mg total) by mouth every 6 (six) hours as needed for congestion. Patient not taking: Reported on 11/04/2014 01/01/14   Godfrey Pick, PA-C  Vitamin D, Ergocalciferol, (DRISDOL) 50000 UNITS CAPS capsule Take 50,000 Units by mouth every 7 (seven) days.  10/26/14   Historical Provider, MD   Social History   Social History  . Marital Status: Married    Spouse Name: N/A  . Number of Children: N/A  . Years of Education: N/A   Occupational History  . Not on file.   Social History Main Topics  . Smoking status: Current  Every Day Smoker  . Smokeless tobacco: Not on file  . Alcohol Use: Yes  . Drug Use: No  . Sexual Activity: Yes   Other Topics Concern  . Not on file   Social History Narrative   Significant Other. Education: [employer]. Exercise daily for 30 minutes.        Review of Systems  Constitutional: Negative for chills.  HENT: Positive for congestion, sinus pressure and sore throat.   Eyes: Positive for itching. Negative for visual disturbance.  Respiratory: Positive for cough and wheezing.   Gastrointestinal: Negative for nausea and vomiting.  Musculoskeletal: Negative for gait problem, neck pain and neck stiffness.  Skin: Negative for  color change and wound.  Neurological: Positive for headaches. Negative for seizures and speech difficulty.       Objective:   Physical Exam  CONSTITUTIONAL: Well developed/well nourished HEAD: Normocephalic/atraumatic EYES: EOMI/PERRL ENMT: significant nasal congestion.  NECK: supple no meningeal signs CV: S1/S2 noted, no murmurs/rubs/gallops noted LUNGS: occasional rhonchi , no rales no wheezes.  NEURO: Pt is awake/alert/appropriate, moves all extremitiesx4.  No facial droop.   EXTREMITIES: pulses normal/equal, full ROM SKIN: warm, color normal PSYCH: no abnormalities of mood noted, alert and oriented to situation   Filed Vitals:   06/09/15 1248  BP: 122/80  Pulse: 96  Temp: 98.7 F (37.1 C)  TempSrc: Oral  Resp: 16  Height: 5\' 6"  (1.676 m)  Weight: 203 lb (92.08 kg)  SpO2: 97%       Assessment & Plan:  She is a smoker. She does have significant nasal congestion. Will treat with Zithromax and Tessalon Perles for cough. She is currently on a sterilely nasal spray and will add Astepro to help with her significant nasal congestion. Recheck if symptoms persist.I personally performed the services described in this documentation, which was scribed in my presence. The recorded information has been reviewed and is accurate.

## 2015-06-09 NOTE — Patient Instructions (Signed)
Acute Bronchitis °Bronchitis is inflammation of the airways that extend from the windpipe into the lungs (bronchi). The inflammation often causes mucus to develop. This leads to a cough, which is the most common symptom of bronchitis.  °In acute bronchitis, the condition usually develops suddenly and goes away over time, usually in a couple weeks. Smoking, allergies, and asthma can make bronchitis worse. Repeated episodes of bronchitis may cause further lung problems.  °CAUSES °Acute bronchitis is most often caused by the same virus that causes a cold. The virus can spread from person to person (contagious) through coughing, sneezing, and touching contaminated objects. °SIGNS AND SYMPTOMS  °· Cough.   °· Fever.   °· Coughing up mucus.   °· Body aches.   °· Chest congestion.   °· Chills.   °· Shortness of breath.   °· Sore throat.   °DIAGNOSIS  °Acute bronchitis is usually diagnosed through a physical exam. Your health care provider will also ask you questions about your medical history. Tests, such as chest X-rays, are sometimes done to rule out other conditions.  °TREATMENT  °Acute bronchitis usually goes away in a couple weeks. Oftentimes, no medical treatment is necessary. Medicines are sometimes given for relief of fever or cough. Antibiotic medicines are usually not needed but may be prescribed in certain situations. In some cases, an inhaler may be recommended to help reduce shortness of breath and control the cough. A cool mist vaporizer may also be used to help thin bronchial secretions and make it easier to clear the chest.  °HOME CARE INSTRUCTIONS °· Get plenty of rest.   °· Drink enough fluids to keep your urine clear or pale yellow (unless you have a medical condition that requires fluid restriction). Increasing fluids may help thin your respiratory secretions (sputum) and reduce chest congestion, and it will prevent dehydration.   °· Take medicines only as directed by your health care provider. °· If  you were prescribed an antibiotic medicine, finish it all even if you start to feel better. °· Avoid smoking and secondhand smoke. Exposure to cigarette smoke or irritating chemicals will make bronchitis worse. If you are a smoker, consider using nicotine gum or skin patches to help control withdrawal symptoms. Quitting smoking will help your lungs heal faster.   °· Reduce the chances of another bout of acute bronchitis by washing your hands frequently, avoiding people with cold symptoms, and trying not to touch your hands to your mouth, nose, or eyes.   °· Keep all follow-up visits as directed by your health care provider.   °SEEK MEDICAL CARE IF: °Your symptoms do not improve after 1 week of treatment.  °SEEK IMMEDIATE MEDICAL CARE IF: °· You develop an increased fever or chills.   °· You have chest pain.   °· You have severe shortness of breath. °· You have bloody sputum.   °· You develop dehydration. °· You faint or repeatedly feel like you are going to pass out. °· You develop repeated vomiting. °· You develop a severe headache. °MAKE SURE YOU:  °· Understand these instructions. °· Will watch your condition. °· Will get help right away if you are not doing well or get worse. °Document Released: 11/02/2004 Document Revised: 02/09/2014 Document Reviewed: 03/18/2013 °Exit[employer]® Patient Information ©2015 [employer]. This information is not intended to replace advice given to you by your health care provider. Make sure you discuss any questions you have with your health care provider. ° °Allergic Rhinitis °Allergic rhinitis is when the mucous membranes in the nose respond to allergens. Allergens are particles in the air that cause   your body to have an allergic reaction. This causes you to release allergic antibodies. Through a chain of events, these eventually cause you to release histamine into the blood stream. Although meant to protect the body, it is this release of histamine that causes your discomfort, such  as frequent sneezing, congestion, and an itchy, runny nose.  °CAUSES  °Seasonal allergic rhinitis (hay fever) is caused by pollen allergens that may come from grasses, trees, and weeds. Year-round allergic rhinitis (perennial allergic rhinitis) is caused by allergens such as house dust mites, pet dander, and mold spores.  °SYMPTOMS  °· Nasal stuffiness (congestion). °· Itchy, runny nose with sneezing and tearing of the eyes. °DIAGNOSIS  °Your health care provider can help you determine the allergen or allergens that trigger your symptoms. If you and your health care provider are unable to determine the allergen, skin or blood testing may be used. °TREATMENT  °Allergic rhinitis does not have a cure, but it can be controlled by: °· Medicines and allergy shots (immunotherapy). °· Avoiding the allergen. °Hay fever may often be treated with antihistamines in pill or nasal spray forms. Antihistamines block the effects of histamine. There are over-the-counter medicines that may help with nasal congestion and swelling around the eyes. Check with your health care provider before taking or giving this medicine.  °If avoiding the allergen or the medicine prescribed do not work, there are many new medicines your health care provider can prescribe. Stronger medicine may be used if initial measures are ineffective. Desensitizing injections can be used if medicine and avoidance does not work. Desensitization is when a patient is given ongoing shots until the body becomes less sensitive to the allergen. Make sure you follow up with your health care provider if problems continue. °HOME CARE INSTRUCTIONS °It is not possible to completely avoid allergens, but you can reduce your symptoms by taking steps to limit your exposure to them. It helps to know exactly what you are allergic to so that you can avoid your specific triggers. °SEEK MEDICAL CARE IF:  °· You have a fever. °· You develop a cough that does not stop easily  (persistent). °· You have shortness of breath. °· You start wheezing. °· Symptoms interfere with normal daily activities. °Document Released: 06/20/2001 Document Revised: 09/30/2013 Document Reviewed: 06/02/2013 °ExitCare® Patient Information ©2015 [employer]. This information is not intended to replace advice given to you by your health care provider. Make sure you discuss any questions you have with your health care provider. ° °

## 2015-06-16 ENCOUNTER — Other Ambulatory Visit: Payer: Self-pay | Admitting: Emergency Medicine

## 2015-08-14 ENCOUNTER — Ambulatory Visit (INDEPENDENT_AMBULATORY_CARE_PROVIDER_SITE_OTHER): Payer: Managed Care, Other (non HMO) | Admitting: Family Medicine

## 2015-08-14 VITALS — BP 122/70 | HR 96 | Temp 99.0°F | Resp 18 | Ht 66.0 in | Wt 203.0 lb

## 2015-08-14 DIAGNOSIS — J209 Acute bronchitis, unspecified: Secondary | ICD-10-CM

## 2015-08-14 MED ORDER — AZITHROMYCIN 250 MG PO TABS: ORAL_TABLET | ORAL | Status: DC

## 2015-08-14 MED ORDER — HYDROCODONE-HOMATROPINE 5-1.5 MG/5ML PO SYRP: 5.0000 mL | ORAL_SOLUTION | Freq: Three times a day (TID) | ORAL | Status: DC | PRN

## 2015-08-14 NOTE — Progress Notes (Signed)
This chart was scribed for Casey SidleKurt Taquilla Downum, MD by Stann Oresung-Kai Tsai, medical scribe at [facility].The patient was seen in exam room 7 and the patient's care was started at 12:12 PM.  Patient ID: Casey Barton MRN: [id number], DOB: 1966-04-19, 49 y.o. Date of Encounter: 08/14/2015  Primary Physician: Gaye AlkenBARNES,ELIZABETH STEWART, MD  Chief Complaint:  Chief Complaint  Patient presents with  . Cough    x 5 days  . Swollen Lymph Nodes    HPI:  Casey Barton is a 49 y.o. female who presents to [facility] complaining of cough for the past 5 days. When she coughs, she feels some discomfort in her back. She felt her lymph nodes being swollen. She noticed that her voice has changed. She's allergic to sulfa. In the past, she's taken zpak with relief. She denies congestion, fever, sore throat.   She's a current smoker.  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   Past Medical History  Diagnosis Date  . Allergy   . Arthritis      Home Meds: Prior to Admission medications   Medication Sig Start Date End Date Taking? Authorizing Provider  Loratadine (CLARITIN PO) Take by mouth.   Yes Historical Provider, MD  Vitamin D, Ergocalciferol, (DRISDOL) 50000 UNITS CAPS capsule Take 50,000 Units by mouth every 7 (seven) days.  10/26/14  Yes Historical Provider, MD  azelastine (ASTELIN) 0.1 % nasal spray Place 2 sprays into both nostrils 2 (two) times daily. Use in each nostril as directed Patient not taking: Reported on 08/14/2015 06/09/15   Collene GobbleSteven A Daub, MD  azithromycin (ZITHROMAX) 250 MG tablet Take 2 tabs PO x 1 dose, then 1 tab PO QD x 4 days Patient not taking: Reported on 08/14/2015 06/09/15   Collene GobbleSteven A Daub, MD  benzonatate (TESSALON) 100 MG capsule Take 1-2 capsules (100-200 mg total) by mouth 3 (three) times daily as needed for cough. Patient not taking: Reported on 08/14/2015 06/09/15   Collene GobbleSteven A Daub, MD  cyclobenzaprine (FLEXERIL) 10 MG tablet Take 1 tablet (10  mg total) by mouth 3 (three) times daily as needed for muscle spasms. Patient not taking: Reported on 11/04/2014 01/01/14   Godfrey PickEleanore E Egan, PA-C  fluticasone [facility](FLONASE) 50 MCG/ACT nasal spray Place 2 sprays into both nostrils daily. Patient not taking: Reported on 11/04/2014 01/01/14   Godfrey PickEleanore E Egan, PA-C  Guaifenesin [facility](MUCINEX MAXIMUM STRENGTH) 1200 MG TB12 Take 1 tablet (1,200 mg total) by mouth every 12 (twelve) hours as needed. Patient not taking: Reported on 11/04/2014 01/01/14   Godfrey PickEleanore E Egan, PA-C  HYDROcodone-acetaminophen (NORCO) 5-325 MG per tablet Take 1 tablet by mouth every 6 (six) hours as needed for moderate pain. Patient not taking: Reported on 06/09/2015 11/04/14   Purvis SheffieldForrest Harrison, MD  ibuprofen (ADVIL,MOTRIN) 800 MG tablet Take 800 mg by mouth every 8 (eight) hours as needed for mild pain.     Historical Provider, MD  pseudoephedrine (SUDAFED) 60 MG tablet Take 1 tablet (60 mg total) by mouth every 6 (six) hours as needed for congestion. Patient not taking: Reported on 11/04/2014 01/01/14   Godfrey PickEleanore E Egan, PA-C    Allergies:  Allergies  Allergen Reactions  . Sulfa Antibiotics     SOB    Social History   Social History  . Marital Status: Married    Spouse Name: N/A  . Number of Children: N/A  . Years of Education: N/A   Occupational History  . Not on file.  Social History Main Topics  . Smoking status: Current Every Day Smoker  . Smokeless tobacco: Not on file  . Alcohol Use: Yes  . Drug Use: No  . Sexual Activity: Yes   Other Topics Concern  . Not on file   Social History Narrative   Significant Other. Education: [employer]. Exercise daily for 30 minutes.     Review of Systems: Constitutional: negative for fever, chills, night sweats, weight changes, or fatigue  HEENT: negative for vision changes, hearing loss, congestion, rhinorrhea, ST, epistaxis, or sinus pressure; positive for voice change Cardiovascular: negative for chest pain or  palpitations Respiratory: negative for hemoptysis, wheezing, shortness of breath; positive for cough Abdominal: negative for abdominal pain, nausea, vomiting, diarrhea, or constipation Dermatological: negative for rash Neurologic: negative for headache, dizziness, or syncope Musc: positive for back pain All other systems reviewed and are otherwise negative with the exception to those above and in the HPI.  Physical Exam: Blood pressure 122/70, pulse 96, temperature 99 F (37.2 C), resp. rate 18, height  (1.676 m), weight 203 lb (92.08 kg), last menstrual period 03/22/2013, SpO2 95 %., Body mass index is 32.78 kg/(m^2). General: Well developed, well nourished, in no acute distress. Head: Normocephalic, atraumatic, eyes without discharge, sclera non-icteric, nares are without discharge. Bilateral auditory canals clear, TM's are without perforation, pearly grey and translucent with reflective cone of light bilaterally. Oral cavity moist, posterior pharynx without exudate, erythema, peritonsillar abscess, or post nasal drip.  Neck: Supple. No thyromegaly. Full ROM. No lymphadenopathy. Lungs: Clear bilaterally to auscultation without wheezes, or rhonchi. Few fine rales anteriorally Heart: RRR with S1 S2. No murmurs, rubs, or gallops appreciated. Abdomen: Soft, non-tender, non-distended with normoactive bowel sounds. No hepatomegaly. No rebound/guarding. No obvious abdominal masses. Msk:  Strength and tone normal for age. Extremities/Skin: Warm and dry. No clubbing or cyanosis. No edema. No rashes or suspicious lesions. Neuro: Alert and oriented X 3. Moves all extremities spontaneously. Gait is normal. CNII-XII grossly in tact. Psych:  Responds to questions appropriately with a normal affect.     ASSESSMENT AND PLAN:  49 y.o. year old female with severe cough in context of cigarette smoking This chart was scribed in my presence and reviewed by me personally.    ICD-9-CM ICD-10-CM   1.  Acute bronchitis, unspecified organism 466.0 J20.9 azithromycin (ZITHROMAX) 250 MG tablet     HYDROcodone-homatropine (HYCODAN) 5-1.5 MG/5ML syrup     Signed, Casey Sidle, MD    By signing my name below, I, Stann Ore, attest that this documentation has been prepared under the direction and in the presence of Casey Sidle, MD. Electronically Signed: Stann Ore, Scribe. 08/14/2015 , 12:12 PM .  Signed, Casey Sidle, MD 08/14/2015 12:12 PM

## 2015-08-14 NOTE — Patient Instructions (Signed)
Smoking Cessation, Tips for Success If you are ready to quit smoking, congratulations! You have chosen to help yourself be healthier. Cigarettes bring nicotine, tar, carbon monoxide, and other irritants into your body. Your lungs, heart, and blood vessels will be able to work better without these poisons. There are many different ways to quit smoking. Nicotine gum, nicotine patches, a nicotine inhaler, or nicotine nasal spray can help with physical craving. Hypnosis, support groups, and medicines help break the habit of smoking. WHAT THINGS CAN I DO TO MAKE QUITTING EASIER?  Here are some tips to help you quit for good:  Pick a date when you will quit smoking completely. Tell all of your friends and family about your plan to quit on that date.  Do not try to slowly cut down on the number of cigarettes you are smoking. Pick a quit date and quit smoking completely starting on that day.  Throw away all cigarettes.   Clean and remove all ashtrays from your home, work, and car.  On a card, write down your reasons for quitting. Carry the card with you and read it when you get the urge to smoke.  Cleanse your body of nicotine. Drink enough water and fluids to keep your urine clear or pale yellow. Do this after quitting to flush the nicotine from your body.  Learn to predict your moods. Do not let a bad situation be your excuse to have a cigarette. Some situations in your life might tempt you into wanting a cigarette.  Never have "just one" cigarette. It leads to wanting another and another. Remind yourself of your decision to quit.  Change habits associated with smoking. If you smoked while driving or when feeling stressed, try other activities to replace smoking. Stand up when drinking your coffee. Brush your teeth after eating. Sit in a different chair when you read the paper. Avoid alcohol while trying to quit, and try to drink fewer caffeinated beverages. Alcohol and caffeine may urge you to  smoke.  Avoid foods and drinks that can trigger a desire to smoke, such as sugary or spicy foods and alcohol.  Ask people who smoke not to smoke around you.  Have something planned to do right after eating or having a cup of coffee. For example, plan to take a walk or exercise.  Try a relaxation exercise to calm you down and decrease your stress. Remember, you may be tense and nervous for the first 2 weeks after you quit, but this will pass.  Find new activities to keep your hands busy. Play with a pen, coin, or rubber band. Doodle or draw things on paper.  Brush your teeth right after eating. This will help cut down on the craving for the taste of tobacco after meals. You can also try mouthwash.   Use oral substitutes in place of cigarettes. Try using lemon drops, carrots, cinnamon sticks, or chewing gum. Keep them handy so they are available when you have the urge to smoke.  When you have the urge to smoke, try deep breathing.  Designate your home as a nonsmoking area.  If you are a heavy smoker, ask your health care provider about a prescription for nicotine chewing gum. It can ease your withdrawal from nicotine.  Reward yourself. Set aside the cigarette money you save and buy yourself something nice.  Look for support from others. Join a support group or smoking cessation program. Ask someone at home or at work to help you with your plan   to quit smoking.  Always ask yourself, "Do I need this cigarette or is this just a reflex?" Tell yourself, "Today, I choose not to smoke," or "I do not want to smoke." You are reminding yourself of your decision to quit.  Do not replace cigarette smoking with electronic cigarettes (commonly called e-cigarettes). The safety of e-cigarettes is unknown, and some may contain harmful chemicals.  If you relapse, do not give up! Plan ahead and think about what you will do the next time you get the urge to smoke. HOW WILL I FEEL WHEN I QUIT SMOKING? You  may have symptoms of withdrawal because your body is used to nicotine (the addictive substance in cigarettes). You may crave cigarettes, be irritable, feel very hungry, cough often, get headaches, or have difficulty concentrating. The withdrawal symptoms are only temporary. They are strongest when you first quit but will go away within 10-14 days. When withdrawal symptoms occur, stay in control. Think about your reasons for quitting. Remind yourself that these are signs that your body is healing and getting used to being without cigarettes. Remember that withdrawal symptoms are easier to treat than the major diseases that smoking can cause.  Even after the withdrawal is over, expect periodic urges to smoke. However, these cravings are generally short lived and will go away whether you smoke or not. Do not smoke! WHAT RESOURCES ARE AVAILABLE TO HELP ME QUIT SMOKING? Your health care provider can direct you to community resources or hospitals for support, which may include:  Group support.  Education.  Hypnosis.  Therapy.   This information is not intended to replace advice given to you by your health care provider. Make sure you discuss any questions you have with your health care provider.   Document Released: 06/23/2004 Document Revised: 10/16/2014 Document Reviewed: 03/13/2013 Elsevier Interactive Patient Education 2016 Elsevier Inc.  Acute Bronchitis Bronchitis is inflammation of the airways that extend from the windpipe into the lungs (bronchi). The inflammation often causes mucus to develop. This leads to a cough, which is the most common symptom of bronchitis.  In acute bronchitis, the condition usually develops suddenly and goes away over time, usually in a couple weeks. Smoking, allergies, and asthma can make bronchitis worse. Repeated episodes of bronchitis may cause further lung problems.  CAUSES Acute bronchitis is most often caused by the same virus that causes a cold. The virus  can spread from person to person (contagious) through coughing, sneezing, and touching contaminated objects. SIGNS AND SYMPTOMS   Cough.   Fever.   Coughing up mucus.   Body aches.   Chest congestion.   Chills.   Shortness of breath.   Sore throat.  DIAGNOSIS  Acute bronchitis is usually diagnosed through a physical exam. Your health care provider will also ask you questions about your medical history. Tests, such as chest X-rays, are sometimes done to rule out other conditions.  TREATMENT  Acute bronchitis usually goes away in a couple weeks. Oftentimes, no medical treatment is necessary. Medicines are sometimes given for relief of fever or cough. Antibiotic medicines are usually not needed but may be prescribed in certain situations. In some cases, an inhaler may be recommended to help reduce shortness of breath and control the cough. A cool mist vaporizer may also be used to help thin bronchial secretions and make it easier to clear the chest.  HOME CARE INSTRUCTIONS  Get plenty of rest.   Drink enough fluids to keep your urine clear or pale yellow (unless   you have a medical condition that requires fluid restriction). Increasing fluids may help thin your respiratory secretions (sputum) and reduce chest congestion, and it will prevent dehydration.   Take medicines only as directed by your health care provider.  If you were prescribed an antibiotic medicine, finish it all even if you start to feel better.  Avoid smoking and secondhand smoke. Exposure to cigarette smoke or irritating chemicals will make bronchitis worse. If you are a smoker, consider using nicotine gum or skin patches to help control withdrawal symptoms. Quitting smoking will help your lungs heal faster.   Reduce the chances of another bout of acute bronchitis by washing your hands frequently, avoiding people with cold symptoms, and trying not to touch your hands to your mouth, nose, or eyes.   Keep  all follow-up visits as directed by your health care provider.  SEEK MEDICAL CARE IF: Your symptoms do not improve after 1 week of treatment.  SEEK IMMEDIATE MEDICAL CARE IF:  You develop an increased fever or chills.   You have chest pain.   You have severe shortness of breath.  You have bloody sputum.   You develop dehydration.  You faint or repeatedly feel like you are going to pass out.  You develop repeated vomiting.  You develop a severe headache. MAKE SURE YOU:   Understand these instructions.  Will watch your condition.  Will get help right away if you are not doing well or get worse.   This information is not intended to replace advice given to you by your health care provider. Make sure you discuss any questions you have with your health care provider.   Document Released: 11/02/2004 Document Revised: 10/16/2014 Document Reviewed: 03/18/2013 Elsevier Interactive Patient Education 2016 Elsevier Inc.  

## 2015-09-05 ENCOUNTER — Ambulatory Visit (INDEPENDENT_AMBULATORY_CARE_PROVIDER_SITE_OTHER): Payer: Managed Care, Other (non HMO) | Admitting: Emergency Medicine

## 2015-09-05 VITALS — BP 128/74 | HR 100 | Temp 98.7°F | Resp 14 | Ht 66.0 in | Wt 205.0 lb

## 2015-09-05 DIAGNOSIS — L309 Dermatitis, unspecified: Secondary | ICD-10-CM | POA: Diagnosis not present

## 2015-09-05 MED ORDER — TRIAMCINOLONE ACETONIDE 0.1 % EX CREA: 1.0000 "application " | TOPICAL_CREAM | Freq: Two times a day (BID) | CUTANEOUS | Status: DC

## 2015-09-05 NOTE — Progress Notes (Signed)
Subjective:  Patient ID: Casey Barton, female    DOB: 01-21-1966  Age: 49 y.o. MRN: [id number]  CC: Elbow Injury   HPI Casey Barton presents  she noticed a rash on her right elbow and proximal forearm over the last week or so she's had it so painful she has no swelling or limitation of motion of her elbow she has no history of eczema or psoriasis. No contact with new allergens or chemicals.  History Casey Barton has a past medical history of Allergy and Arthritis.   She has no past surgical history on file.   Her  family history includes Heart disease in her father.  She   reports that she has been smoking.  She does not have any smokeless tobacco history on file. She reports that she drinks alcohol. She reports that she does not use illicit drugs.  Outpatient Prescriptions Prior to Visit  Medication Sig Dispense Refill  . azelastine (ASTELIN) 0.1 % nasal spray Place 2 sprays into both nostrils 2 (two) times daily. Use in each nostril as directed (Patient not taking: Reported on 08/14/2015) 30 mL 12  . azithromycin (ZITHROMAX) 250 MG tablet Take 2 tabs PO x 1 dose, then 1 tab PO QD x 4 days (Patient not taking: Reported on 09/05/2015) 6 tablet 0  . benzonatate (TESSALON) 100 MG capsule Take 1-2 capsules (100-200 mg total) by mouth 3 (three) times daily as needed for cough. (Patient not taking: Reported on 08/14/2015) 40 capsule 0  . cyclobenzaprine (FLEXERIL) 10 MG tablet Take 1 tablet (10 mg total) by mouth 3 (three) times daily as needed for muscle spasms. (Patient not taking: Reported on 11/04/2014) 30 tablet 0  . fluticasone (FLONASE) 50 MCG/ACT nasal spray Place 2 sprays into both nostrils daily. (Patient not taking: Reported on 11/04/2014) 16 g 12  . Guaifenesin (MUCINEX MAXIMUM STRENGTH) 1200 MG TB12 Take 1 tablet (1,200 mg total) by mouth every 12 (twelve) hours as needed. (Patient not taking: Reported on 11/04/2014) 14 tablet 1  . HYDROcodone-acetaminophen (NORCO) 5-325 MG per  tablet Take 1 tablet by mouth every 6 (six) hours as needed for moderate pain. (Patient not taking: Reported on 06/09/2015) 10 tablet 0  . HYDROcodone-homatropine (HYCODAN) 5-1.5 MG/5ML syrup Take 5 mLs by mouth every 8 (eight) hours as needed for cough. (Patient not taking: Reported on 09/05/2015) 120 mL 0  . ibuprofen (ADVIL,MOTRIN) 800 MG tablet Take 800 mg by mouth every 8 (eight) hours as needed for mild pain.     . Loratadine (CLARITIN PO) Take by mouth.    . pseudoephedrine (SUDAFED) 60 MG tablet Take 1 tablet (60 mg total) by mouth every 6 (six) hours as needed for congestion. (Patient not taking: Reported on 11/04/2014) 15 tablet 0  . Vitamin D, Ergocalciferol, (DRISDOL) 50000 UNITS CAPS capsule Take 50,000 Units by mouth every 7 (seven) days.   0   No facility-administered medications prior to visit.    Social History   Social History  . Marital Status: Married    Spouse Name: N/A  . Number of Children: N/A  . Years of Education: N/A   Social History Main Topics  . Smoking status: Current Every Day Smoker  . Smokeless tobacco: None  . Alcohol Use: Yes  . Drug Use: No  . Sexual Activity: Yes   Other Topics Concern  . None   Social History Narrative   Significant Other. Education: [employer]. Exercise daily for 30 minutes.     Review of  Systems  Constitutional: Negative for fever, chills and appetite change.  HENT: Negative for congestion, ear pain, postnasal drip, sinus pressure and sore throat.   Eyes: Negative for pain and redness.  Respiratory: Negative for cough, shortness of breath and wheezing.   Cardiovascular: Negative for leg swelling.  Gastrointestinal: Negative for nausea, vomiting, abdominal pain, diarrhea, constipation and blood in stool.  Endocrine: Negative for polyuria.  Genitourinary: Negative for dysuria, urgency, frequency and flank pain.  Musculoskeletal: Negative for gait problem.  Skin: Positive for rash.  Neurological: Negative for weakness  and headaches.  Psychiatric/Behavioral: Negative for confusion and decreased concentration. The patient is not nervous/anxious.     Objective:  BP 128/74 mmHg  Pulse 100  Temp(Src) 98.7 F (37.1 C) (Oral)  Resp 14  Ht 5\' 6"  (1.676 m)  Wt 205 lb (92.987 kg)  BMI 33.10 kg/m2  SpO2 96%  LMP 03/22/2013  Physical Exam  Constitutional: She is oriented to person, place, and time. She appears well-developed and well-nourished.  HENT:  Head: Normocephalic and atraumatic.  Eyes: Conjunctivae are normal. Pupils are equal, round, and reactive to light.  Pulmonary/Chest: Effort normal.  Musculoskeletal: She exhibits no edema.  Neurological: She is alert and oriented to person, place, and time.  Skin: Skin is dry. Rash (She has a rash on her left proximal fifth forearm that's scaly and erythematous.) noted.  Psychiatric: She has a normal mood and affect. Her behavior is normal. Thought content normal.      Assessment & Plan:   Casey Barton was seen today for elbow injury.  Diagnoses and all orders for this visit:  Eczema  Other orders -     triamcinolone cream (KENALOG) 0.1 %; Apply 1 application topically 2 (two) times daily.   I have discontinued [name]'s ibuprofen, fluticasone, Guaifenesin, cyclobenzaprine, pseudoephedrine, Vitamin D (Ergocalciferol), HYDROcodone-acetaminophen, azelastine, benzonatate, Loratadine (CLARITIN PO), azithromycin, and HYDROcodone-homatropine. I am also having her start on triamcinolone cream.  Meds ordered this encounter  Medications  . triamcinolone cream (KENALOG) 0.1 %    Sig: Apply 1 application topically 2 (two) times daily.    Dispense:  45 g    Refill:  1   I offered to send her to dermatology she would wait medication work that doesn't calm her her eczema down the center to see a dermatologist  Appropriate red flag conditions were discussed with the patient as well as actions that should be taken.  Patient expressed his  understanding.  Follow-up: Return if symptoms worsen or fail to improve.  [name], [name], MD

## 2015-09-05 NOTE — Patient Instructions (Signed)
Eczema Eczema, also called atopic dermatitis, is a skin disorder that causes inflammation of the skin. It causes a red rash and dry, scaly skin. The skin becomes very itchy. Eczema is generally worse during the cooler winter months and often improves with the warmth of summer. Eczema usually starts showing signs in infancy. Some children outgrow eczema, but it may last through adulthood.  CAUSES  The exact cause of eczema is not known, but it appears to run in families. People with eczema often have a family history of eczema, allergies, asthma, or hay fever. Eczema is not contagious. Flare-ups of the condition may be caused by:   Contact with something you are sensitive or allergic to.   Stress. SIGNS AND SYMPTOMS  Dry, scaly skin.   Red, itchy rash.   Itchiness. This may occur before the skin rash and may be very intense.  DIAGNOSIS  The diagnosis of eczema is usually made based on symptoms and medical history. TREATMENT  Eczema cannot be cured, but symptoms usually can be controlled with treatment and other strategies. A treatment plan might include:  Controlling the itching and scratching.   Use over-the-counter antihistamines as directed for itching. This is especially useful at night when the itching tends to be worse.   Use over-the-counter steroid creams as directed for itching.   Avoid scratching. Scratching makes the rash and itching worse. It may also result in a skin infection (impetigo) due to a break in the skin caused by scratching.   Keeping the skin well moisturized with creams every day. This will seal in moisture and help prevent dryness. Lotions that contain alcohol and water should be avoided because they can dry the skin.   Limiting exposure to things that you are sensitive or allergic to (allergens).   Recognizing situations that cause stress.   Developing a plan to manage stress.  HOME CARE INSTRUCTIONS   Only take over-the-counter or  prescription medicines as directed by your health care provider.   Do not use anything on the skin without checking with your health care provider.   Keep baths or showers short (5 minutes) in warm (not hot) water. Use mild cleansers for bathing. These should be unscented. You may add nonperfumed bath oil to the bath water. It is best to avoid soap and bubble bath.   Immediately after a bath or shower, when the skin is still damp, apply a moisturizing ointment to the entire body. This ointment should be a petroleum ointment. This will seal in moisture and help prevent dryness. The thicker the ointment, the better. These should be unscented.   Keep fingernails cut short. Children with eczema may need to wear soft gloves or mittens at night after applying an ointment.   Dress in clothes made of cotton or cotton blends. Dress lightly, because heat increases itching.   A child with eczema should stay away from anyone with fever blisters or cold sores. The virus that causes fever blisters (herpes simplex) can cause a serious skin infection in children with eczema. SEEK MEDICAL CARE IF:   Your itching interferes with sleep.   Your rash gets worse or is not better within 1 week after starting treatment.   You see pus or soft yellow scabs in the rash area.   You have a fever.   You have a rash flare-up after contact with someone who has fever blisters.    This information is not intended to replace advice given to you by your health care   provider. Make sure you discuss any questions you have with your health care provider.   Document Released: 09/22/2000 Document Revised: 07/16/2013 Document Reviewed: 04/28/2013 Elsevier Interactive Patient Education 2016 Elsevier Inc.  

## 2015-10-20 ENCOUNTER — Ambulatory Visit (INDEPENDENT_AMBULATORY_CARE_PROVIDER_SITE_OTHER): Payer: Managed Care, Other (non HMO) | Admitting: Medical

## 2015-10-20 ENCOUNTER — Encounter: Payer: Self-pay | Admitting: Medical

## 2015-10-20 VITALS — BP 122/78 | HR 88 | Temp 97.5°F | Ht 66.0 in | Wt 206.8 lb

## 2015-10-20 DIAGNOSIS — M5432 Sciatica, left side: Secondary | ICD-10-CM

## 2015-10-20 DIAGNOSIS — J0100 Acute maxillary sinusitis, unspecified: Secondary | ICD-10-CM

## 2015-10-20 MED ORDER — AZITHROMYCIN 250 MG PO TABS: ORAL_TABLET | ORAL | Status: DC

## 2015-10-20 MED ORDER — FLUTICASONE PROPIONATE 50 MCG/ACT NA SUSP: 2.0000 | Freq: Every day | NASAL | Status: DC

## 2015-10-20 MED ORDER — DICLOFENAC SODIUM 75 MG PO TBEC: 75.0000 mg | DELAYED_RELEASE_TABLET | Freq: Two times a day (BID) | ORAL | Status: DC

## 2015-10-20 MED ORDER — CYCLOBENZAPRINE HCL 5 MG PO TABS: 5.0000 mg | ORAL_TABLET | Freq: Every day | ORAL | Status: DC

## 2015-10-20 MED ORDER — HYDROCODONE-ACETAMINOPHEN 5-325 MG PO TABS: 1.0000 | ORAL_TABLET | Freq: Four times a day (QID) | ORAL | Status: DC | PRN

## 2015-10-20 NOTE — Progress Notes (Signed)
Subjective:    Patient ID: Casey Barton, female    DOB: 17-Jul-1966, 50 y.o.   MRN: [id number]  HPI   Pt in with some back pain. Back pain started about one week ago. At first pain was on and off. But last 2 days pain has been constant. No reported injury or fall. Pain came on gradually. Pt denies any history of any back pain in the past. Pt points to left si area. Pt taking ibuprofen otc but not helping. Pain not radicular. No saddle anesthesia. No leg weakness. No incontinence.  Pt stats about one week ago mild dry cough on and off. Pt takes claritin. No sneezing. Mild nasal congestion and some mild frontal sinus pain. Pt has known allergies.    Review of Systems  Constitutional: Negative for fever, chills and fatigue.  HENT: Positive for congestion, rhinorrhea and sinus pressure. Negative for postnasal drip.        When blows nose get some mucous out.  Respiratory: Positive for cough.   Cardiovascular: Negative for chest pain and palpitations.  Gastrointestinal: Negative for abdominal pain.  Musculoskeletal: Positive for back pain.  Neurological: Negative for dizziness and headaches.       Iniitially pt reported no radicular pain then she noted at end slight sensation of numbness left side of her foot.  Hematological: Negative for adenopathy. Does not bruise/bleed easily.  Psychiatric/Behavioral: Negative for behavioral problems and confusion.    Past Medical History  Diagnosis Date  . Allergy   . Arthritis     Social History   Social History  . Marital Status: Married    Spouse Name: N/A  . Number of Children: N/A  . Years of Education: N/A   Occupational History  . Not on file.   Social History Main Topics  . Smoking status: Current Every Day Smoker  . Smokeless tobacco: Not on file  . Alcohol Use: Yes  . Drug Use: No  . Sexual Activity: Yes   Other Topics Concern  . Not on file   Social History Narrative   Significant Other. Education: [employer].  Exercise daily for 30 minutes.    No past surgical history on file.  Family History  Problem Relation Age of Onset  . Heart disease Father     Allergies  Allergen Reactions  . Sulfa Antibiotics     SOB    No current outpatient prescriptions on file prior to visit.   No current facility-administered medications on file prior to visit.    BP 122/78 mmHg  Pulse 88  Temp(Src) 97.5 F (36.4 C) (Oral)  Ht 5\' 6"  (1.676 m)  Wt 206 lb 12.8 oz (93.804 kg)  BMI 33.39 kg/m2  SpO2 95%  LMP 03/22/2013       Objective:   Physical Exam  General Appearance- Not in acute distress.     General  Mental Status - Alert. General Appearance - Well groomed. Not in acute distress.    HEENT Head- Normal. Ear Auditory Canal - Left- Normal. Right - Normal.Tympanic Membrane- Left- Normal. Right- Normal. Eye Sclera/Conjunctiva- Left- Normal. Right- Normal. Nose & Sinuses Nasal Mucosa- Left-  Boggy and Congested. Right-  Boggy and  Congested.Bilateral no  maxillary but frontal sinus pressure. Mouth & Throat Lips: Upper Lip- Normal: no dryness, cracking, pallor, cyanosis, or vesicular eruption. Lower Lip-Normal: no dryness, cracking, pallor, cyanosis or vesicular eruption. Buccal Mucosa- Bilateral- No Aphthous ulcers. Oropharynx- No Discharge or Erythema. Tonsils: Characteristics- Bilateral- No Erythema or Congestion.  Size/Enlargement- Bilateral- No enlargement. Discharge- bilateral-None.   Chest and Lung Exam Auscultation: Breath sounds:-Normal. Clear even and unlabored. Adventitious sounds:- No Adventitious sounds.  Cardiovascular Auscultation:Rythm - Regular, rate and rythm. Heart Sounds -Normal heart sounds.  Abdomen Inspection:-Inspection Normal.  Palpation/Perucssion: Palpation and Percussion of the abdomen reveal- Non Tender, No Rebound tenderness, No rigidity(Guarding) and No Palpable abdominal masses.  Liver:-Normal.  Spleen:- Normal.   Back No mid lumbar spine  tenderness to palpation. No Pain on straight leg lift. Pain on lateral movements and flexion/extension of the spine. Pain directly in lt si area.  Lower ext neurologic  L5-S1 sensation intact bilaterally. Normal patellar reflexes bilaterally. No foot drop bilaterally.      Assessment & Plan:  Your appear to have a sinus infection. I am prescribing  azithromycin  antibiotic for the infection. To help with the nasal congestion I prescribed  flonase nasal steroid.(pt also appears to have some underlying allergies as well)   For your sciatica pain prescription of diclofenac antiinflammatory, norco pain med and flexeril muscle relaxant    Follow up in 7 days or as needed.

## 2015-10-20 NOTE — Progress Notes (Signed)
Pre visit review using our clinic review tool, if applicable. No additional management support is needed unless otherwise documented below in the visit note. 

## 2015-10-20 NOTE — Patient Instructions (Addendum)
Your appear to have a sinus infection. I am prescribing  azithromycin  antibiotic for the infection. To help with the nasal congestion I prescribed  flonase nasal steroid.   For your sciatica pain prescription of diclofenac antiinflammatory, norco pain med and flexeril muscle relaxant   Follow up in 7 days or as needed.

## 2015-10-21 ENCOUNTER — Telehealth: Payer: Self-pay | Admitting: Family Medicine

## 2015-10-21 NOTE — Telephone Encounter (Signed)
Both the norco and muscle relaxant are sedating. Diclofenac is nonsedating. She could not take dicolfenac tomorrow am. Then come in for toradol 60 mg im nurse visit. Then resume diclofenac on Saturday.

## 2015-10-21 NOTE — Telephone Encounter (Signed)
Spoke with pt and she voices understanding. Pt is willing to come in tomorrow for a nurse visit for a toradol injection.

## 2015-10-21 NOTE — Telephone Encounter (Signed)
Caller name: [name]  Relationship to patient: Self   Can be reached: [phone]  Reason for call: Pt says that Ramon DredgeEdward prescribed her 2 medications for her back pain on her previous visit. Pt says diclofenac is the only one that she can take during the day and work but pt says that it doesn't help her with the pain at all. Pt says that it's hard for her to work feeling in pain. Pt would like to know if provider can prescribe something different for her during the day OR if she would need another appt.?    Please advise further.

## 2015-10-21 NOTE — Telephone Encounter (Signed)
Please see note below and advise  

## 2015-10-22 ENCOUNTER — Ambulatory Visit (INDEPENDENT_AMBULATORY_CARE_PROVIDER_SITE_OTHER): Payer: Managed Care, Other (non HMO) | Admitting: Behavioral Health

## 2015-10-22 DIAGNOSIS — M545 Low back pain: Secondary | ICD-10-CM

## 2015-10-22 MED ORDER — KETOROLAC TROMETHAMINE 60 MG/2ML IM SOLN
60.0000 mg | Freq: Once | INTRAMUSCULAR | Status: AC
  Administered 2015-10-22: 60 mg via INTRAMUSCULAR

## 2015-10-22 NOTE — Progress Notes (Signed)
Pre visit review using our clinic review tool, if applicable. No additional management support is needed unless otherwise documented below in the visit note.  Patient tolerated injection well.  

## 2015-11-12 ENCOUNTER — Telehealth: Payer: Self-pay | Admitting: *Deleted

## 2015-11-12 NOTE — Telephone Encounter (Signed)
Unable to reach patient at time of pre-visit call. Left message for patient to return call when available.  

## 2015-11-15 ENCOUNTER — Ambulatory Visit (INDEPENDENT_AMBULATORY_CARE_PROVIDER_SITE_OTHER): Payer: Managed Care, Other (non HMO) | Admitting: Family

## 2015-11-15 ENCOUNTER — Encounter: Payer: Self-pay | Admitting: Family

## 2015-11-15 ENCOUNTER — Telehealth: Payer: Self-pay | Admitting: Family

## 2015-11-15 ENCOUNTER — Ambulatory Visit (HOSPITAL_BASED_OUTPATIENT_CLINIC_OR_DEPARTMENT_OTHER)
Admission: RE | Admit: 2015-11-15 | Discharge: 2015-11-15 | Disposition: A | Discharge status: Home / Self Care | Payer: Managed Care, Other (non HMO) | Source: Ambulatory Visit | Attending: Family | Admitting: Family

## 2015-11-15 VITALS — BP 143/83 | HR 86 | Temp 98.4°F | Resp 16 | Ht 66.0 in | Wt 205.8 lb

## 2015-11-15 DIAGNOSIS — Z23 Encounter for immunization: Secondary | ICD-10-CM

## 2015-11-15 DIAGNOSIS — Z0000 Encounter for general adult medical examination without abnormal findings: Secondary | ICD-10-CM | POA: Diagnosis not present

## 2015-11-15 DIAGNOSIS — Z1231 Encounter for screening mammogram for malignant neoplasm of breast: Secondary | ICD-10-CM | POA: Diagnosis present

## 2015-11-15 DIAGNOSIS — R3129 Other microscopic hematuria: Secondary | ICD-10-CM

## 2015-11-15 LAB — URINALYSIS, ROUTINE W REFLEX MICROSCOPIC
Bilirubin Urine: NEGATIVE
Ketones, ur: NEGATIVE
Leukocytes, UA: NEGATIVE
Nitrite: NEGATIVE
Specific Gravity, Urine: >=1.03 — AB
Total Protein, Urine: NEGATIVE
Urine Glucose: NEGATIVE
Urobilinogen, UA: 0.2
pH: 5.5 (ref 5.0–8.0)

## 2015-11-15 LAB — LIPID PANEL
Cholesterol: 173 mg/dL (ref 0–200)
HDL: 30.3 mg/dL — ABNORMAL LOW
LDL Cholesterol: 105 mg/dL — ABNORMAL HIGH (ref 0–99)
NonHDL: 143.16
Total CHOL/HDL Ratio: 6
Triglycerides: 192 mg/dL — ABNORMAL HIGH (ref 0.0–149.0)
VLDL: 38.4 mg/dL (ref 0.0–40.0)

## 2015-11-15 LAB — BASIC METABOLIC PANEL
BUN: 19 mg/dL (ref 6–23)
CHLORIDE: 107 meq/L (ref 96–112)
CO2: 27 mEq/L (ref 19–32)
Calcium: 9.7 mg/dL (ref 8.4–10.5)
Creatinine, Ser: 0.71 mg/dL (ref 0.40–1.20)
GFR: 92.03 mL/min (ref >60.00)
Glucose, Bld: 111 mg/dL — ABNORMAL HIGH (ref 70–99)
Potassium: 4 mEq/L (ref 3.5–5.1)
Sodium: 141 mEq/L (ref 135–145)

## 2015-11-15 LAB — HEPATIC FUNCTION PANEL
ALT: 21 U/L (ref 0–35)
AST: 17 U/L (ref 0–37)
Albumin: 4.1 g/dL (ref 3.5–5.2)
Alkaline Phosphatase: 102 U/L (ref 39–117)
Bilirubin, Direct: 0 mg/dL (ref 0.0–0.3)
Total Bilirubin: 0.4 mg/dL (ref 0.2–1.2)
Total Protein: 7 g/dL (ref 6.0–8.3)

## 2015-11-15 LAB — CBC WITH DIFFERENTIAL/PLATELET
BASOS PCT: 0.6 % (ref 0.0–3.0)
Basophils Absolute: 0.1 10*3/uL (ref 0.0–0.1)
EOS ABS: 0.2 10*3/uL (ref 0.0–0.7)
EOS PCT: 1.7 % (ref 0.0–5.0)
HCT: 45.2 % (ref 36.0–46.0)
HEMOGLOBIN: 15.4 g/dL — AB (ref 12.0–15.0)
Lymphocytes Relative: 39 % (ref 12.0–46.0)
Lymphs Abs: 3.7 10*3/uL (ref 0.7–4.0)
MCHC: 34 g/dL (ref 30.0–36.0)
MCV: 92.6 fl (ref 78.0–100.0)
MONOS PCT: 6 % (ref 3.0–12.0)
Monocytes Absolute: 0.6 10*3/uL (ref 0.1–1.0)
Neutro Abs: 5.1 10*3/uL (ref 1.4–7.7)
Neutrophils Relative %: 52.7 % (ref 43.0–77.0)
Platelets: 196 10*3/uL (ref 150.0–400.0)
RBC: 4.89 Mil/uL (ref 3.87–5.11)
RDW: 13 % (ref 11.5–15.5)
WBC: 9.6 10*3/uL (ref 4.0–10.5)

## 2015-11-15 LAB — TSH: TSH: 0.55 u[IU]/mL (ref 0.35–4.50)

## 2015-11-15 LAB — HEPATITIS C ANTIBODY: HCV Ab: NEGATIVE

## 2015-11-15 LAB — HIV ANTIBODY (ROUTINE TESTING W REFLEX): HIV 1&2 Ab, 4th Generation: NONREACTIVE

## 2015-11-15 NOTE — Telephone Encounter (Signed)
Hep C and HIV negative.  Other labs look good.

## 2015-11-15 NOTE — Assessment & Plan Note (Addendum)
Discussed healthy diet, exercise, weight loss. We also discussed smoking cessation. She is not yet ready to quit. BP mildly elevated. Advised pt on low sodium diet, will plan to bring her back in 3 months to repeat her blood pressure. Refer for colo/mammo, pt is instructed to contact her GYN to arrange Pap.  Tdap today.

## 2015-11-15 NOTE — Telephone Encounter (Addendum)
Please let pt know that urine is + for blood. Please confirm no dysuria/frequency.  I would like her to repeat UA in 2 weeks please.  Dx hematuria.   Also, sugar and triglyceriedes are elevated, please ask lab to add on A1C.

## 2015-11-15 NOTE — Progress Notes (Signed)
Subjective:    Patient ID: Casey Barton, female    DOB: April 22, 1966, 50 y.o.   MRN: [id number]  HPI  Casey Barton is a 50 yr old female who presents today to establish care.She was seen last month for an acute visit (sinusitis and low back pain). Pt reports that these symptoms have improved.   Patient presents today for complete physical.  Immunizations: Tetanus is due, had a flu shot in October 2016.   Diet: reports diet is generally healthy Exercise: reports that she walks some.  No formal exercise.  Colonoscopy:due Pap Smear: GYN- Dr. Marcelle Overlie Mammogram: 2015 Dental: up to date Vision: up to date  BP Readings from Last 3 Encounters:  11/15/15 143/83  10/20/15 122/78  09/05/15 128/74      Review of Systems  Constitutional: Negative for unexpected weight change.  HENT: Negative for hearing loss.   Eyes: Negative for visual disturbance.  Respiratory: Negative for shortness of breath.   Cardiovascular: Negative for leg swelling.  Gastrointestinal: Negative for diarrhea, constipation and blood in stool.  Genitourinary: Negative for dysuria and frequency.  Musculoskeletal: Negative for myalgias and arthralgias.  Skin: Negative for rash.  Neurological: Negative for headaches.  Hematological: Negative for adenopathy.  Psychiatric/Behavioral:       Denies depression or anxiety      Past Medical History  Diagnosis Date  . Allergy   . Arthritis     Social History   Social History  . Marital Status: Married    Spouse Name: N/A  . Number of Children: N/A  . Years of Education: N/A   Occupational History  . Not on file.   Social History Main Topics  . Smoking status: Current Every Day Smoker  . Smokeless tobacco: Not on file  . Alcohol Use: 0.6 oz/week    1 Standard drinks or equivalent per week  . Drug Use: No  . Sexual Activity: Yes   Other Topics Concern  . Not on file   Social History Narrative   Significant Other. Education: [employer]. Exercise  daily for 30 minutes.    History reviewed. No pertinent past surgical history.  Family History  Problem Relation Age of Onset  . Heart disease Father   . Kidney disease Father   . Stroke Mother   . Parkinson's disease Brother     Allergies  Allergen Reactions  . Sulfa Antibiotics     SOB    Current Outpatient Prescriptions on File Prior to Visit  Medication Sig Dispense Refill  . cyclobenzaprine (FLEXERIL) 5 MG tablet Take 1 tablet (5 mg total) by mouth at bedtime. 7 tablet 0  . diclofenac (VOLTAREN) 75 MG EC tablet Take 1 tablet (75 mg total) by mouth 2 (two) times daily. 30 tablet 0  . fluticasone (FLONASE) 50 MCG/ACT nasal spray Place 2 sprays into both nostrils daily. 16 g 1  . HYDROcodone-acetaminophen (NORCO) 5-325 MG tablet Take 1 tablet by mouth every 6 (six) hours as needed for moderate pain. 30 tablet 0  . loratadine (CLARITIN) 10 MG tablet Take 10 mg by mouth daily.     No current facility-administered medications on file prior to visit.    BP 143/83 mmHg  Pulse 86  Temp(Src) 98.4 F (36.9 C) (Oral)  Resp 16  Ht  (1.676 m)  Wt 205 lb 12.8 oz (93.35 kg)  BMI 33.23 kg/m2  SpO2 96%  LMP 03/22/2013    Objective:   Physical Exam  Physical Exam  Constitutional: She  is oriented to person, place, and time. She appears well-developed and well-nourished. No distress.  HENT:  Head: Normocephalic and atraumatic.  Right Ear: Tympanic membrane and ear canal normal.  Left Ear: Tympanic membrane and ear canal normal.  Mouth/Throat: Oropharynx is clear and moist.  Eyes: Pupils are equal, round, and reactive to light. No scleral icterus.  Neck: Normal range of motion. No thyromegaly present.  Cardiovascular: Normal rate and regular rhythm.   No murmur heard. Pulmonary/Chest: Effort normal and breath sounds normal. No respiratory distress.Mild soft R sided expiratory wheeze. She has no rales. She exhibits no tenderness.  Abdominal: Soft. Bowel sounds are normal.  He exhibits no distension and no mass. There is no tenderness. There is no rebound and no guarding.  Musculoskeletal: She exhibits no edema.  Lymphadenopathy:    She has no cervical adenopathy.  Neurological: She is alert and oriented to person, place, and time. She has normal patellar reflexes. She exhibits normal muscle tone. Coordination normal.  Skin: Skin is warm and dry.  Psychiatric: She has a normal mood and affect. Her behavior is normal. Judgment and thought content normal.  Breasts/pelvic: deferred to GYN        Assessment & Plan:         Assessment & Plan:  EKG tracing is personally reviewed.  EKG notes NSR.  No acute changes.

## 2015-11-15 NOTE — Progress Notes (Signed)
Pre visit review using our clinic review tool, if applicable. No additional management support is needed unless otherwise documented below in the visit note. 

## 2015-11-15 NOTE — Patient Instructions (Addendum)
Please complete lab work prior to leaving. Schedule mammogram on the first floor. You will be contacted about scheduling your colonoscopy.  Please schedule follow up with Dr. Marcelle Overlie for your pap smear.

## 2015-11-16 ENCOUNTER — Other Ambulatory Visit (INDEPENDENT_AMBULATORY_CARE_PROVIDER_SITE_OTHER): Payer: Managed Care, Other (non HMO)

## 2015-11-16 ENCOUNTER — Encounter: Payer: Self-pay | Admitting: Family

## 2015-11-16 DIAGNOSIS — R7303 Prediabetes: Secondary | ICD-10-CM | POA: Insufficient documentation

## 2015-11-16 DIAGNOSIS — R739 Hyperglycemia, unspecified: Secondary | ICD-10-CM | POA: Diagnosis not present

## 2015-11-16 HISTORY — DX: Prediabetes: R73.03

## 2015-11-16 LAB — HEMOGLOBIN A1C: HEMOGLOBIN A1C: 6.4 % (ref 4.6–6.5)

## 2015-11-16 NOTE — Telephone Encounter (Signed)
Left message at work # to return my call.

## 2015-11-16 NOTE — Telephone Encounter (Signed)
Pt is returning your call. Advised her of the below.    CB on work line : [phone]

## 2015-11-16 NOTE — Telephone Encounter (Signed)
Add on form faxed to the lab. Left message on cell# to return my call.

## 2015-11-16 NOTE — Telephone Encounter (Signed)
Notified pt of below labs. She denies current UTI symptoms as below. States she was referred to a urologist about 5 yrs ago for hematuria. Doesn't remember who she saw and was seen 1 time. States "it was a waste of her time and money because they didn't know why she had hematuria either". Pt scheduled lab visit for 11/30/15, future order entered. Please advise hgb a1c result?

## 2015-11-17 NOTE — Telephone Encounter (Signed)
Lab Results  Component Value Date   HGBA1C 6.4 11/16/2015   Shows borderline DM.  I believe I sent you a lab note as well. tks

## 2015-11-17 NOTE — Telephone Encounter (Signed)
Did not see lab note but do see mychart message that was sent to pt.

## 2015-12-01 ENCOUNTER — Other Ambulatory Visit (INDEPENDENT_AMBULATORY_CARE_PROVIDER_SITE_OTHER): Payer: Managed Care, Other (non HMO)

## 2015-12-01 DIAGNOSIS — R3129 Other microscopic hematuria: Secondary | ICD-10-CM | POA: Diagnosis not present

## 2015-12-02 ENCOUNTER — Telehealth: Payer: Self-pay | Admitting: Family

## 2015-12-02 DIAGNOSIS — R3129 Other microscopic hematuria: Secondary | ICD-10-CM

## 2015-12-02 LAB — URINALYSIS, ROUTINE W REFLEX MICROSCOPIC
Bilirubin Urine: NEGATIVE
GLUCOSE, UA: NEGATIVE
Ketones, ur: NEGATIVE
LEUKOCYTES UA: NEGATIVE
NITRITE: NEGATIVE
PH: 5.5 (ref 5.0–8.0)
Protein, ur: NEGATIVE
SPECIFIC GRAVITY, URINE: 1.014 (ref 1.001–1.035)

## 2015-12-02 LAB — URINALYSIS, MICROSCOPIC ONLY
BACTERIA UA: NONE SEEN [HPF]
Casts: NONE SEEN [LPF]
Crystals: NONE SEEN [HPF]
Yeast: NONE SEEN [HPF]

## 2015-12-02 NOTE — Telephone Encounter (Signed)
Please let pt know that I reviewed follow up urinalysis and it shows hematuria.  Given her hx of smoking, I would recommend that she follow back up with urology for re-evaluation because smokers have increased risk of bladder cancer. I am not sure what testing was completed with her previous work up.

## 2015-12-02 NOTE — Telephone Encounter (Signed)
Called patient advised of result notes of M. Osullivan,NP Patient agreed to contact urology

## 2015-12-03 ENCOUNTER — Ambulatory Visit (INDEPENDENT_AMBULATORY_CARE_PROVIDER_SITE_OTHER): Payer: Managed Care, Other (non HMO) | Admitting: Family

## 2015-12-03 VITALS — BP 120/86 | HR 94 | Temp 97.8°F | Ht 66.0 in | Wt 197.4 lb

## 2015-12-03 DIAGNOSIS — H6693 Otitis media, unspecified, bilateral: Secondary | ICD-10-CM

## 2015-12-03 MED ORDER — ALBUTEROL SULFATE HFA 108 (90 BASE) MCG/ACT IN AERS: 2.0000 | INHALATION_SPRAY | Freq: Four times a day (QID) | RESPIRATORY_TRACT | Status: AC | PRN

## 2015-12-03 MED ORDER — AMOXICILLIN 500 MG PO CAPS: 500.0000 mg | ORAL_CAPSULE | Freq: Three times a day (TID) | ORAL | Status: DC

## 2015-12-03 NOTE — Progress Notes (Signed)
Subjective:    Patient ID: Casey Barton, female    DOB: 01/12/1966, 50 y.o.   MRN: [id number]  HPI  Ms. Cannedy is a 50 yr old female who presents today with chief complaint of L  otalgia.  Pt reports that pain began 3 days ago. + dry cough. Denies nasal congestion. Does report + frontal HA.  Denies dizziness,nausea, tinnitis. Did note some drainage from the left ear. No improvement in her ear pain.  Pain radiates down the left anterior neck.  Denies dysphagia. Multiple sick contacts at work.     Review of Systems    see HPI  Past Medical History  Diagnosis Date  . Allergy   . Arthritis   . Borderline diabetes 11/16/2015    Social History   Social History  . Marital Status: Married    Spouse Name: N/A  . Number of Children: N/A  . Years of Education: N/A   Occupational History  . Not on file.   Social History Main Topics  . Smoking status: Current Every Day Smoker  . Smokeless tobacco: Not on file  . Alcohol Use: 0.6 oz/week    1 Standard drinks or equivalent per week  . Drug Use: No  . Sexual Activity: Yes   Other Topics Concern  . Not on file   Social History Narrative   Education: [employer].    Exercise daily for 30 minutes.   Works as  Teaching laboratory technician at [employer]   Divorced   1 son- lives in [place]   Enjoys going to concerts, swimming in the summer.        No past surgical history on file.  Family History  Problem Relation Age of Onset  . Heart disease Father   . Kidney disease Father   . Stroke Mother   . Parkinson's disease Brother     Allergies  Allergen Reactions  . Sulfa Antibiotics     SOB    Current Outpatient Prescriptions on File Prior to Visit  Medication Sig Dispense Refill  . fluticasone (FLONASE) 50 MCG/ACT nasal spray Place 2 sprays into both nostrils daily. 16 g 1  . loratadine (CLARITIN) 10 MG tablet Take 10 mg by mouth daily.    . cyclobenzaprine (FLEXERIL) 5 MG tablet Take 1 tablet (5 mg total) by  mouth at bedtime. (Patient not taking: Reported on 12/03/2015) 7 tablet 0  . diclofenac (VOLTAREN) 75 MG EC tablet Take 1 tablet (75 mg total) by mouth 2 (two) times daily. (Patient not taking: Reported on 12/03/2015) 30 tablet 0  . HYDROcodone-acetaminophen (NORCO) 5-325 MG tablet Take 1 tablet by mouth every 6 (six) hours as needed for moderate pain. (Patient not taking: Reported on 12/03/2015) 30 tablet 0   No current facility-administered medications on file prior to visit.    BP 120/86 mmHg  Pulse 94  Temp(Src) 97.8 F (36.6 C) (Oral)  Ht  (1.676 m)  Wt 197 lb 6.4 oz (89.54 kg)  BMI 31.88 kg/m2  SpO2 96%  LMP 03/22/2013    Objective:   Physical Exam  Constitutional: She appears well-developed and well-nourished.  HENT:  Mouth/Throat: No oropharyngeal exudate, posterior oropharyngeal edema or posterior oropharyngeal erythema.  L TM intact, mild erythema, no bulging R TM intact- more erythema noted than on left. No bulging  Cardiovascular: Normal rate, regular rhythm and normal heart sounds.   No murmur heard. Pulmonary/Chest: Effort normal and breath sounds normal. No respiratory distress.  + expiratory wheeze  noted, cleared with cough  Psychiatric: She has a normal mood and affect. Her behavior is normal. Judgment and thought content normal.          Assessment & Plan:  Bilateral OM- rx with amoxicillin, add albuterol prn for cough/wheezing, reminded pt on the importance of smoking cessation.

## 2015-12-03 NOTE — Progress Notes (Signed)
Pre visit review using our clinic review tool, if applicable. No additional management support is needed unless otherwise documented below in the visit note. 

## 2015-12-03 NOTE — Patient Instructions (Signed)
Start amoxicillin for your ear infection. Start albuterol for cough/wheezing.  Call if symptoms worsen or do not improve.

## 2015-12-05 ENCOUNTER — Encounter: Payer: Self-pay | Admitting: Family

## 2015-12-06 ENCOUNTER — Encounter: Payer: Self-pay | Admitting: Family

## 2016-02-15 ENCOUNTER — Telehealth: Payer: Self-pay | Admitting: Family

## 2016-02-16 ENCOUNTER — Ambulatory Visit: Payer: Managed Care, Other (non HMO) | Admitting: Family

## 2016-04-26 NOTE — Telephone Encounter (Signed)
Complete

## 2017-01-12 ENCOUNTER — Encounter (HOSPITAL_COMMUNITY): Payer: Self-pay | Admitting: Emergency Medicine

## 2017-01-12 ENCOUNTER — Emergency Department (HOSPITAL_COMMUNITY): Payer: Managed Care, Other (non HMO)

## 2017-01-12 ENCOUNTER — Emergency Department (HOSPITAL_COMMUNITY)
Admission: EM | Admit: 2017-01-12 | Discharge: 2017-01-12 | Disposition: A | Discharge status: Home / Self Care | Payer: Managed Care, Other (non HMO) | Attending: Emergency Medicine | Admitting: Emergency Medicine

## 2017-01-12 DIAGNOSIS — Z79899 Other long term (current) drug therapy: Secondary | ICD-10-CM | POA: Insufficient documentation

## 2017-01-12 DIAGNOSIS — F17200 Nicotine dependence, unspecified, uncomplicated: Secondary | ICD-10-CM | POA: Diagnosis not present

## 2017-01-12 DIAGNOSIS — E860 Dehydration: Secondary | ICD-10-CM

## 2017-01-12 DIAGNOSIS — R112 Nausea with vomiting, unspecified: Secondary | ICD-10-CM | POA: Diagnosis present

## 2017-01-12 DIAGNOSIS — K2900 Acute gastritis without bleeding: Secondary | ICD-10-CM | POA: Diagnosis not present

## 2017-01-12 LAB — COMPREHENSIVE METABOLIC PANEL
ALBUMIN: 4.5 g/dL (ref 3.5–5.0)
ALK PHOS: 118 U/L (ref 38–126)
ALT: 21 U/L (ref 14–54)
AST: 22 U/L (ref 15–41)
Anion gap: 9 (ref 5–15)
BILIRUBIN TOTAL: 0.7 mg/dL (ref 0.3–1.2)
BUN: 14 mg/dL (ref 6–20)
CALCIUM: 9.3 mg/dL (ref 8.9–10.3)
CO2: 22 mmol/L (ref 22–32)
Chloride: 104 mmol/L (ref 101–111)
Creatinine, Ser: 0.7 mg/dL (ref 0.44–1.00)
GFR calc Af Amer: >60 mL/min (ref >60)
GLUCOSE: 123 mg/dL — AB (ref 65–99)
Potassium: 3.7 mmol/L (ref 3.5–5.1)
Sodium: 135 mmol/L (ref 135–145)
TOTAL PROTEIN: 8 g/dL (ref 6.5–8.1)

## 2017-01-12 LAB — CBC WITH DIFFERENTIAL/PLATELET
BASOS PCT: 0 %
Basophils Absolute: 0 10*3/uL (ref 0.0–0.1)
Eosinophils Absolute: 0 10*3/uL (ref 0.0–0.7)
Eosinophils Relative: 0 %
HEMATOCRIT: 47.9 % — AB (ref 36.0–46.0)
HEMOGLOBIN: 16.9 g/dL — AB (ref 12.0–15.0)
LYMPHS ABS: 2 10*3/uL (ref 0.7–4.0)
LYMPHS PCT: 17 %
MCH: 31.3 pg (ref 26.0–34.0)
MCHC: 35.3 g/dL (ref 30.0–36.0)
MCV: 88.7 fL (ref 78.0–100.0)
MONO ABS: 0.4 10*3/uL (ref 0.1–1.0)
Monocytes Relative: 3 %
NEUTROS ABS: 9.4 10*3/uL — AB (ref 1.7–7.7)
NEUTROS PCT: 80 %
Platelets: 222 10*3/uL (ref 150–400)
RBC: 5.4 MIL/uL — ABNORMAL HIGH (ref 3.87–5.11)
RDW: 12.6 % (ref 11.5–15.5)
WBC: 11.9 10*3/uL — ABNORMAL HIGH (ref 4.0–10.5)

## 2017-01-12 LAB — I-STAT CHEM 8, ED
BUN: 15 mg/dL (ref 6–20)
CHLORIDE: 105 mmol/L (ref 101–111)
CREATININE: 0.6 mg/dL (ref 0.44–1.00)
Calcium, Ion: 1.11 mmol/L — ABNORMAL LOW (ref 1.15–1.40)
Glucose, Bld: 126 mg/dL — ABNORMAL HIGH (ref 65–99)
HEMATOCRIT: 51 % — AB (ref 36.0–46.0)
Hemoglobin: 17.3 g/dL — ABNORMAL HIGH (ref 12.0–15.0)
POTASSIUM: 3.8 mmol/L (ref 3.5–5.1)
SODIUM: 140 mmol/L (ref 135–145)
TCO2: 23 mmol/L (ref 0–100)

## 2017-01-12 LAB — URINALYSIS, ROUTINE W REFLEX MICROSCOPIC
BILIRUBIN URINE: NEGATIVE
Bacteria, UA: NONE SEEN
GLUCOSE, UA: NEGATIVE mg/dL
KETONES UR: NEGATIVE mg/dL
Leukocytes, UA: NEGATIVE
Nitrite: NEGATIVE
PH: 5 (ref 5.0–8.0)
Protein, ur: 100 mg/dL — AB
Specific Gravity, Urine: 1.02 (ref 1.005–1.030)

## 2017-01-12 MED ORDER — GI COCKTAIL ~~LOC~~
30.0000 mL | Freq: Once | ORAL | Status: AC
Start: 2017-01-12 — End: 2017-01-12
  Administered 2017-01-12: 30 mL via ORAL
  Filled 2017-01-12: qty 30

## 2017-01-12 MED ORDER — METOCLOPRAMIDE HCL 5 MG/ML IJ SOLN
10.0000 mg | Freq: Once | INTRAMUSCULAR | Status: AC
  Administered 2017-01-12: 10 mg via INTRAVENOUS
  Filled 2017-01-12: qty 2

## 2017-01-12 MED ORDER — ONDANSETRON 4 MG PO TBDP: 4.0000 mg | ORAL_TABLET | Freq: Three times a day (TID) | ORAL | 0 refills | Status: DC | PRN

## 2017-01-12 MED ORDER — RANITIDINE HCL 150 MG PO CAPS: 150.0000 mg | ORAL_CAPSULE | Freq: Two times a day (BID) | ORAL | 0 refills | Status: AC

## 2017-01-12 MED ORDER — SUCRALFATE 1 GM/10ML PO SUSP: 1.0000 g | Freq: Three times a day (TID) | ORAL | 0 refills | Status: DC

## 2017-01-12 MED ORDER — IOPAMIDOL (ISOVUE-300) INJECTION 61%
100.0000 mL | Freq: Once | INTRAVENOUS | Status: AC | PRN
Start: 2017-01-12 — End: 2017-01-12
  Administered 2017-01-12: 15:00:00 via INTRAVENOUS
  Administered 2017-01-12: 100 mL via INTRAVENOUS

## 2017-01-12 MED ORDER — DIPHENHYDRAMINE HCL 50 MG/ML IJ SOLN
25.0000 mg | Freq: Once | INTRAMUSCULAR | Status: AC
  Administered 2017-01-12: 25 mg via INTRAVENOUS
  Filled 2017-01-12: qty 1

## 2017-01-12 MED ORDER — IOPAMIDOL (ISOVUE-300) INJECTION 61%
INTRAVENOUS | Status: AC
  Filled 2017-01-12: qty 100

## 2017-01-12 MED ORDER — PROMETHAZINE HCL 25 MG RE SUPP: 25.0000 mg | Freq: Four times a day (QID) | RECTAL | 0 refills | Status: DC | PRN

## 2017-01-12 MED ORDER — HYDROMORPHONE HCL 1 MG/ML IJ SOLN
1.0000 mg | Freq: Once | INTRAMUSCULAR | Status: AC
  Administered 2017-01-12: 1 mg via INTRAVENOUS
  Filled 2017-01-12: qty 1

## 2017-01-12 MED ORDER — SODIUM CHLORIDE 0.9 % IV BOLUS (SEPSIS)
1000.0000 mL | Freq: Once | INTRAVENOUS | Status: AC
  Administered 2017-01-12: 1000 mL via INTRAVENOUS

## 2017-01-12 MED ORDER — ONDANSETRON HCL 4 MG/2ML IJ SOLN
4.0000 mg | Freq: Once | INTRAMUSCULAR | Status: AC
  Administered 2017-01-12: 4 mg via INTRAVENOUS
  Filled 2017-01-12: qty 2

## 2017-01-12 MED ORDER — HYDROMORPHONE HCL 2 MG/ML IJ SOLN
1.0000 mg | Freq: Once | INTRAMUSCULAR | Status: AC
  Administered 2017-01-12: 1 mg via INTRAVENOUS
  Filled 2017-01-12: qty 1

## 2017-01-12 NOTE — ED Notes (Signed)
Bed: WA19 Expected date:  Expected time:  Means of arrival:  Comments: EMS/abd. pain 

## 2017-01-12 NOTE — ED Triage Notes (Signed)
Per [facility] pt c/o upper left quadrant abdominal pain onset 0500 today. Tender to touch. Pt also had 5-6 episodes of emesis today.

## 2017-01-12 NOTE — ED Notes (Signed)
Pt was given ice water with 1 pack of graham crackers, 2 packs of saltine crackers, 2 packs of peanut butter, pt is tolerating well.

## 2017-01-12 NOTE — ED Provider Notes (Signed)
[facility]-EMERGENCY DEPT Provider Note   CSN: [id number] Arrival date & time: 01/12/17  1127     History   Chief Complaint Chief Complaint  Patient presents with  . Abdominal Pain    HPI Casey Barton is a 51 y.o. female.  HPI   51 yo F with PMHx mild diabetes here with nausea, vomiting, abdominal pain. Pt ate Casey Barton's chili last night before bed, then woke up 5-6 hours later with severe abdominal pain. Pt describes severe aching, gnawing epigastric pain with nausea, vomiting. She has been unable to eat or drink today. She has no diarrhea. No sick contacts. Pain worse with eating, palpation btu denies any alleviating factors. No recent trauma. No RUQ pain.  Past Medical History:  Diagnosis Date  . Allergy   . Arthritis   . Borderline diabetes 11/16/2015    Patient Active Problem List   Diagnosis Date Noted  . Borderline diabetes 11/16/2015  . Preventative health care 11/15/2015    History reviewed. No pertinent surgical history.  OB History    No data available       Home Medications    Prior to Admission medications   Medication Sig Start Date End Date Taking? Authorizing Provider  albuterol (PROVENTIL HFA;VENTOLIN HFA) 108 (90 Base) MCG/ACT inhaler Inhale 2 puffs into the lungs every 6 (six) hours as needed for wheezing or shortness of breath. 12/03/15   Sandford Craze, NP  amoxicillin (AMOXIL) 500 MG capsule Take 1 capsule (500 mg total) by mouth 3 (three) times daily. 12/03/15   Sandford Craze, NP  cyclobenzaprine (FLEXERIL) 5 MG tablet Take 1 tablet (5 mg total) by mouth at bedtime. Patient not taking: Reported on 12/03/2015 10/20/15   [name], PA-C  diclofenac (VOLTAREN) 75 MG EC tablet Take 1 tablet (75 mg total) by mouth 2 (two) times daily. Patient not taking: Reported on 12/03/2015 10/20/15   [name], PA-C  fluticasone [facility]) 50 MCG/ACT nasal spray Place 2 sprays into both nostrils daily. 10/20/15   [name], PA-C    HYDROcodone-acetaminophen (NORCO) 5-325 MG tablet Take 1 tablet by mouth every 6 (six) hours as needed for moderate pain. Patient not taking: Reported on 12/03/2015 10/20/15   [name], PA-C  loratadine (CLARITIN) 10 MG tablet Take 10 mg by mouth daily.    Historical Provider, MD  ondansetron (ZOFRAN ODT) 4 MG disintegrating tablet Take 1 tablet (4 mg total) by mouth every 8 (eight) hours as needed for nausea or vomiting. 01/12/17   [name], MD  promethazine (PHENERGAN) 25 MG suppository Place 1 suppository (25 mg total) rectally every 6 (six) hours as needed for refractory nausea / vomiting. 01/12/17   [name], MD  ranitidine (ZANTAC) 150 MG capsule Take 1 capsule (150 mg total) by mouth 2 (two) times daily. 01/12/17 01/19/17  [name], MD  sucralfate (CARAFATE) 1 GM/10ML suspension Take 10 mLs (1 g total) by mouth 4 (four) times daily -  with meals and at bedtime. 01/12/17   [name], MD    Family History Family History  Problem Relation Age of Onset  . Heart disease Father   . Kidney disease Father   . Stroke Mother   . Parkinson's disease Brother     Social History Social History  Substance Use Topics  . Smoking status: Current Every Day Smoker  . Smokeless tobacco: Not on file  . Alcohol use 0.6 oz/week    1 Standard drinks or equivalent per week     Allergies  Sulfa antibiotics   Review of Systems Review of Systems  Constitutional: Positive for fatigue. Negative for chills and fever.  HENT: Negative for congestion, rhinorrhea and sore throat.   Eyes: Negative for visual disturbance.  Respiratory: Negative for cough, shortness of breath and wheezing.   Cardiovascular: Negative for chest pain and leg swelling.  Gastrointestinal: Positive for abdominal pain, nausea and vomiting. Negative for diarrhea.  Genitourinary: Negative for dysuria, flank pain, vaginal bleeding and vaginal discharge.  Musculoskeletal: Negative for neck pain.  Skin: Negative  for rash.  Allergic/Immunologic: Negative for immunocompromised state.  Neurological: Negative for syncope and headaches.  Hematological: Does not bruise/bleed easily.  All other systems reviewed and are negative.    Physical Exam Updated Vital Signs BP 126/73 (BP Location: Left Arm)   Pulse 75   Temp 98.4 F (36.9 C) (Oral)   Resp 19   Ht  (1.676 m)   Wt 190 lb (86.2 kg)   LMP 03/23/2013   SpO2 93%   BMI 30.67 kg/m   Physical Exam  Constitutional: She is oriented to person, place, and time. She appears well-developed and well-nourished. No distress.  HENT:  Head: Normocephalic and atraumatic.  Mouth/Throat: Oropharynx is clear and moist.  Eyes: Conjunctivae are normal.  Neck: Neck supple.  Cardiovascular: Normal rate, regular rhythm and normal heart sounds.  Exam reveals no friction rub.   No murmur heard. Pulmonary/Chest: Effort normal and breath sounds normal. No respiratory distress. She has no wheezes. She has no rales.  Abdominal: Soft. She exhibits no distension. There is tenderness (moderate, epigastric). There is no rebound and no guarding.  Musculoskeletal: She exhibits no edema.  Neurological: She is alert and oriented to person, place, and time. She exhibits normal muscle tone.  Skin: Skin is warm. Capillary refill takes less than 2 seconds.  Psychiatric: She has a normal mood and affect.  Nursing note and vitals reviewed.    ED Treatments / Results  Labs (all labs ordered are listed, but only abnormal results are displayed) Labs Reviewed  CBC WITH DIFFERENTIAL/PLATELET - Abnormal; Notable for the following:       Result Value   WBC 11.9 (*)    RBC 5.40 (*)    Hemoglobin 16.9 (*)    HCT 47.9 (*)    Neutro Abs 9.4 (*)    All other components within normal limits  COMPREHENSIVE METABOLIC PANEL - Abnormal; Notable for the following:    Glucose, Bld 123 (*)    All other components within normal limits  URINALYSIS, ROUTINE W REFLEX MICROSCOPIC -  Abnormal; Notable for the following:    Hgb urine dipstick LARGE (*)    Protein, ur 100 (*)    Squamous Epithelial / LPF 0-5 (*)    All other components within normal limits  I-STAT CHEM 8, ED - Abnormal; Notable for the following:    Glucose, Bld 126 (*)    Calcium, Ion 1.11 (*)    Hemoglobin 17.3 (*)    HCT 51.0 (*)    All other components within normal limits    EKG  EKG Interpretation None       Radiology Ct Abdomen Pelvis W Contrast  Result Date: 01/12/2017 CLINICAL DATA:  Abdominal pain with nausea and vomiting. EXAM: CT ABDOMEN AND PELVIS WITH CONTRAST TECHNIQUE: Multidetector CT imaging of the abdomen and pelvis was performed using the standard protocol following bolus administration of intravenous contrast. CONTRAST:  ISOVUE-300 IOPAMIDOL (ISOVUE-300) INJECTION 61% COMPARISON:  None. FINDINGS: Lower chest:  Scattered coronary artery calcifications. No acute abnormality. Heart size is normal. Hepatobiliary: No focal liver abnormality is seen. No gallstones, gallbladder wall thickening, or biliary dilatation. Pancreas: Unremarkable. No pancreatic ductal dilatation or surrounding inflammatory changes. Spleen: Normal in size without focal abnormality. Adrenals/Urinary Tract: Adrenal glands are unremarkable. Kidneys are normal, without renal calculi, focal lesion, or hydronephrosis. Bladder is unremarkable. Stomach/Bowel: Stomach is within normal limits. Appendix appears normal. No evidence of bowel wall thickening, distention, or inflammatory changes. Vascular/Lymphatic: Aortic atherosclerosis. No enlarged abdominal or pelvic lymph nodes. Reproductive: Uterus and bilateral adnexa are unremarkable. Other: No abdominal wall hernia or abnormality. No abdominopelvic ascites. Musculoskeletal: No acute or significant osseous findings. IMPRESSION: Benign-appearing abdomen and pelvis. Aortic atherosclerosis.  Coronary artery calcifications. Electronically Signed   By: Francene Boyers M.D.    On: 01/12/2017 14:47    Procedures Procedures (including critical care time)  Medications Ordered in ED Medications  sodium chloride 0.9 % bolus 1,000 mL (0 mLs Intravenous Stopped 01/12/17 1350)  ondansetron (ZOFRAN) injection 4 mg (4 mg Intravenous Given 01/12/17 1233)  HYDROmorphone (DILAUDID) injection 1 mg (1 mg Intravenous Given 01/12/17 1233)  sodium chloride 0.9 % bolus 1,000 mL (0 mLs Intravenous Stopped 01/12/17 1333)  iopamidol (ISOVUE-300) 61 % injection 100 mL ( Intravenous Contrast Given 01/12/17 1523)  HYDROmorphone (DILAUDID) injection 1 mg (1 mg Intravenous Given 01/12/17 1454)  metoCLOPramide (REGLAN) injection 10 mg (10 mg Intravenous Given 01/12/17 1455)  diphenhydrAMINE (BENADRYL) injection 25 mg (25 mg Intravenous Given 01/12/17 1455)  gi cocktail (Maalox,Lidocaine,Donnatal) (30 mLs Oral Given 01/12/17 1522)     Initial Impression / Assessment and Plan / ED Course  I have reviewed the triage vital signs and the nursing notes.  Pertinent labs & imaging results that were available during my care of the patient were reviewed by me and considered in my medical decision making (see chart for details).     51 yo F with PMHx as above here with n/v, epigastric pain after eating chili. Suspect gastritis versus food-borne illness. No RUQ TTP, LFTs normal, doubt cholecystitis. CT neg without acute abnormality, no obstruction. Labs o/w reassuring - mild leukocytosis is likely reactive and due to vomiting, dehydration. Doubt sepsis. Afebrile. Pt feels improved with IVF, antiemetics here - will d/c with supportive care and outpt follow-up.   Final Clinical Impressions(s) / ED Diagnoses   Final diagnoses:  Non-intractable vomiting with nausea, unspecified vomiting type  Dehydration  Other acute gastritis without hemorrhage    New Prescriptions New Prescriptions   ONDANSETRON (ZOFRAN ODT) 4 MG DISINTEGRATING TABLET    Take 1 tablet (4 mg total) by mouth every 8 (eight) hours as needed for  nausea or vomiting.   PROMETHAZINE (PHENERGAN) 25 MG SUPPOSITORY    Place 1 suppository (25 mg total) rectally every 6 (six) hours as needed for refractory nausea / vomiting.   RANITIDINE (ZANTAC) 150 MG CAPSULE    Take 1 capsule (150 mg total) by mouth 2 (two) times daily.   SUCRALFATE (CARAFATE) 1 GM/10ML SUSPENSION    Take 10 mLs (1 g total) by mouth 4 (four) times daily -  with meals and at bedtime.     [name], MD 01/12/17 [phone]

## 2017-01-12 NOTE — ED Notes (Signed)
Patient transported to CT 

## 2017-01-14 ENCOUNTER — Emergency Department (HOSPITAL_COMMUNITY)
Admission: EM | Admit: 2017-01-14 | Discharge: 2017-01-14 | Disposition: A | Discharge status: Home / Self Care | Payer: Managed Care, Other (non HMO) | Attending: Emergency Medicine | Admitting: Emergency Medicine

## 2017-01-14 ENCOUNTER — Emergency Department (HOSPITAL_COMMUNITY): Payer: Managed Care, Other (non HMO)

## 2017-01-14 ENCOUNTER — Encounter (HOSPITAL_COMMUNITY): Payer: Self-pay | Admitting: Emergency Medicine

## 2017-01-14 DIAGNOSIS — R1012 Left upper quadrant pain: Secondary | ICD-10-CM

## 2017-01-14 DIAGNOSIS — F17200 Nicotine dependence, unspecified, uncomplicated: Secondary | ICD-10-CM | POA: Diagnosis not present

## 2017-01-14 DIAGNOSIS — R109 Unspecified abdominal pain: Secondary | ICD-10-CM | POA: Diagnosis present

## 2017-01-14 DIAGNOSIS — K2900 Acute gastritis without bleeding: Secondary | ICD-10-CM

## 2017-01-14 LAB — COMPREHENSIVE METABOLIC PANEL
ALBUMIN: 4.2 g/dL (ref 3.5–5.0)
ALT: 25 U/L (ref 14–54)
ANION GAP: 9 (ref 5–15)
AST: 25 U/L (ref 15–41)
Alkaline Phosphatase: 102 U/L (ref 38–126)
BILIRUBIN TOTAL: 0.8 mg/dL (ref 0.3–1.2)
BUN: 7 mg/dL (ref 6–20)
CHLORIDE: 102 mmol/L (ref 101–111)
CO2: 24 mmol/L (ref 22–32)
Calcium: 9.5 mg/dL (ref 8.9–10.3)
Creatinine, Ser: 0.6 mg/dL (ref 0.44–1.00)
GFR calc Af Amer: >60 mL/min (ref >60)
GLUCOSE: 127 mg/dL — AB (ref 65–99)
POTASSIUM: 3.4 mmol/L — AB (ref 3.5–5.1)
Sodium: 135 mmol/L (ref 135–145)
TOTAL PROTEIN: 7.4 g/dL (ref 6.5–8.1)

## 2017-01-14 LAB — URINALYSIS, ROUTINE W REFLEX MICROSCOPIC
BACTERIA UA: NONE SEEN
BILIRUBIN URINE: NEGATIVE
Glucose, UA: NEGATIVE mg/dL
KETONES UR: NEGATIVE mg/dL
LEUKOCYTES UA: NEGATIVE
NITRITE: NEGATIVE
PROTEIN: NEGATIVE mg/dL
Specific Gravity, Urine: 1.002 — ABNORMAL LOW (ref 1.005–1.030)
pH: 7 (ref 5.0–8.0)

## 2017-01-14 LAB — I-STAT TROPONIN, ED: TROPONIN I, POC: 0 ng/mL (ref 0.00–0.08)

## 2017-01-14 LAB — CBC
HEMATOCRIT: 45.6 % (ref 36.0–46.0)
HEMOGLOBIN: 16.3 g/dL — AB (ref 12.0–15.0)
MCH: 32.1 pg (ref 26.0–34.0)
MCHC: 35.7 g/dL (ref 30.0–36.0)
MCV: 89.8 fL (ref 78.0–100.0)
Platelets: 216 10*3/uL (ref 150–400)
RBC: 5.08 MIL/uL (ref 3.87–5.11)
RDW: 12.5 % (ref 11.5–15.5)
WBC: 12.5 10*3/uL — AB (ref 4.0–10.5)

## 2017-01-14 LAB — LIPASE, BLOOD: LIPASE: 32 U/L (ref 11–51)

## 2017-01-14 MED ORDER — SODIUM CHLORIDE 0.9 % IV BOLUS (SEPSIS)
1000.0000 mL | Freq: Once | INTRAVENOUS | Status: AC
  Administered 2017-01-14: 1000 mL via INTRAVENOUS

## 2017-01-14 MED ORDER — DICYCLOMINE HCL 20 MG PO TABS: 20.0000 mg | ORAL_TABLET | Freq: Two times a day (BID) | ORAL | 0 refills | Status: AC

## 2017-01-14 MED ORDER — PANTOPRAZOLE SODIUM 40 MG PO TBEC
40.0000 mg | DELAYED_RELEASE_TABLET | Freq: Once | ORAL | Status: AC
  Administered 2017-01-14: 40 mg via ORAL
  Filled 2017-01-14: qty 1

## 2017-01-14 MED ORDER — PANTOPRAZOLE SODIUM 20 MG PO TBEC: 40.0000 mg | DELAYED_RELEASE_TABLET | Freq: Two times a day (BID) | ORAL | 0 refills | Status: AC

## 2017-01-14 MED ORDER — ONDANSETRON HCL 4 MG/2ML IJ SOLN
4.0000 mg | Freq: Once | INTRAMUSCULAR | Status: AC
  Administered 2017-01-14: 4 mg via INTRAVENOUS
  Filled 2017-01-14: qty 2

## 2017-01-14 MED ORDER — GI COCKTAIL ~~LOC~~
30.0000 mL | Freq: Once | ORAL | Status: AC
  Administered 2017-01-14: 30 mL via ORAL
  Filled 2017-01-14: qty 30

## 2017-01-14 MED ORDER — DICYCLOMINE HCL 10 MG/ML IM SOLN
20.0000 mg | Freq: Once | INTRAMUSCULAR | Status: AC
  Administered 2017-01-14: 20 mg via INTRAMUSCULAR
  Filled 2017-01-14: qty 2

## 2017-01-14 NOTE — ED Triage Notes (Signed)
Pt c/o LLQ abdominal pain onset 0500 Friday, came to ED by ambulance on Friday. Dry heaves, no diarrhea. Unable to tolerate much PO food or fluid.

## 2017-01-14 NOTE — ED Provider Notes (Signed)
[facility]-EMERGENCY DEPT Provider Note   CSN: [id number] Arrival date & time: 01/14/17  4098     History   Chief Complaint Chief Complaint  Patient presents with  . Abdominal Pain    HPI Casey Barton is a 51 y.o. female.  HPI   Reports sharp pain LUQ, has been constant over the last 12 hr. Yesterday it improved however it came back.  Nothing seems to make it better or worse. Took some tylenol which didn't help.  Has not been able to eat since Friday. Medicines she received on Friday seemed to help.  Was vomiting up until last night, now dry heaves.  Yesterday vomited 4 times.  Friday pain started around noon and was seen in the ED.  No diarrhea.  Has not had BM since Thursday.  No vaginal bleeding/discharge/urinary symptoms. Possible fever last night.  Pain all left side.   No hx of NSAID use, etoh use. No hx of recent immobilization. Stopped taking narcotics 3wk ago after wrist surgery. Borderline DM.  Hx of smoking.   Past Medical History:  Diagnosis Date  . Allergy   . Arthritis   . Borderline diabetes 11/16/2015    Patient Active Problem List   Diagnosis Date Noted  . Borderline diabetes 11/16/2015  . Preventative health care 11/15/2015    History reviewed. No pertinent surgical history.  OB History    No data available       Home Medications    Prior to Admission medications   Medication Sig Start Date End Date Taking? Authorizing Provider  acetaminophen (TYLENOL) 500 MG tablet Take 1,000 mg by mouth every 6 (six) hours as needed.   Yes Historical Provider, MD  loratadine (CLARITIN) 10 MG tablet Take 10 mg by mouth daily.   Yes Historical Provider, MD  ranitidine (ZANTAC) 150 MG capsule Take 1 capsule (150 mg total) by mouth 2 (two) times daily. 01/12/17 01/19/17 Yes [name], MD  sucralfate (CARAFATE) 1 GM/10ML suspension Take 10 mLs (1 g total) by mouth 4 (four) times daily -  with meals and at bedtime. 01/12/17  Yes [name], MD  albuterol (PROVENTIL  HFA;VENTOLIN HFA) 108 (90 Base) MCG/ACT inhaler Inhale 2 puffs into the lungs every 6 (six) hours as needed for wheezing or shortness of breath. Patient not taking: Reported on 01/14/2017 12/03/15   Sandford Craze, NP  dicyclomine (BENTYL) 20 MG tablet Take 1 tablet (20 mg total) by mouth 2 (two) times daily. 01/14/17   Alvira Monday, MD  ondansetron (ZOFRAN ODT) 4 MG disintegrating tablet Take 1 tablet (4 mg total) by mouth every 8 (eight) hours as needed for nausea or vomiting. 01/12/17   [name], MD  pantoprazole (PROTONIX) 20 MG tablet Take 2 tablets (40 mg total) by mouth 2 (two) times daily. 01/14/17 01/28/17  Alvira Monday, MD    Family History Family History  Problem Relation Age of Onset  . Heart disease Father   . Kidney disease Father   . Stroke Mother   . Parkinson's disease Brother     Social History Social History  Substance Use Topics  . Smoking status: Current Every Day Smoker  . Smokeless tobacco: Not on file  . Alcohol use 0.6 oz/week    1 Standard drinks or equivalent per week     Allergies   Sulfa antibiotics   Review of Systems Review of Systems  Constitutional: Negative for fever.  HENT: Negative for congestion and sore throat.   Eyes: Negative for visual disturbance.  Respiratory: Positive for shortness of breath (because of pain). Negative for cough.   Cardiovascular: Negative for chest pain.  Gastrointestinal: Positive for abdominal pain, nausea and vomiting. Negative for constipation and diarrhea.  Genitourinary: Negative for difficulty urinating and dysuria.  Musculoskeletal: Negative for back pain and neck pain.  Skin: Negative for rash.  Neurological: Negative for syncope and headaches.     Physical Exam Updated Vital Signs BP (!) 182/90   Pulse 81   Temp 97.6 F (36.4 C) (Oral)   Resp (!) 22   LMP 03/23/2013   SpO2 98%   Physical Exam  Constitutional: She is oriented to person, place, and time. She appears well-developed and  well-nourished. No distress.  HENT:  Head: Normocephalic and atraumatic.  Eyes: Conjunctivae and EOM are normal.  Neck: Normal range of motion.  Cardiovascular: Normal rate, regular rhythm, normal heart sounds and intact distal pulses.  Exam reveals no gallop and no friction rub.   No murmur heard. Pulmonary/Chest: Effort normal and breath sounds normal. No respiratory distress. She has no wheezes. She has no rales.  Abdominal: Soft. She exhibits no distension. There is tenderness (LUQ tenderness). There is no guarding, no tenderness at McBurney's point and negative Murphy's sign.  Musculoskeletal: She exhibits no edema or tenderness.  Neurological: She is alert and oriented to person, place, and time.  Skin: Skin is warm and dry. No rash noted. She is not diaphoretic. No erythema.  Nursing note and vitals reviewed.    ED Treatments / Results  Labs (all labs ordered are listed, but only abnormal results are displayed) Labs Reviewed  COMPREHENSIVE METABOLIC PANEL - Abnormal; Notable for the following:       Result Value   Potassium 3.4 (*)    Glucose, Bld 127 (*)    All other components within normal limits  CBC - Abnormal; Notable for the following:    WBC 12.5 (*)    Hemoglobin 16.3 (*)    All other components within normal limits  URINALYSIS, ROUTINE W REFLEX MICROSCOPIC - Abnormal; Notable for the following:    Color, Urine STRAW (*)    Specific Gravity, Urine 1.002 (*)    Hgb urine dipstick LARGE (*)    Squamous Epithelial / LPF 0-5 (*)    All other components within normal limits  LIPASE, BLOOD  I-STAT TROPOININ, ED    EKG  EKG Interpretation  Date/Time:  Sunday January 14 2017 09:57:55 EDT Ventricular Rate:  68 PR Interval:    QRS Duration: 96 QT Interval:  413 QTC Calculation: 440 R Axis:   62 Text Interpretation:  Sinus rhythm No significant change since last tracing Confirmed by [facility] MD, Susi Goslin ([id number]) on 01/14/2017 10:23:23 AM       Radiology Dg Chest  2 View  Result Date: 01/14/2017 CLINICAL DATA:  Pt c/o luq pain, nvd, fever, cough, chills, weakness, body aches x 3 days. Pt reports she was seen in ED x 2 days ago and told she possibly had the flu, no improvement in symptoms. EXAM: CHEST  2 VIEW COMPARISON:  Chest x-ray dated 11/04/2014. FINDINGS: Heart size is normal. Overall cardiomediastinal silhouette is stable. Atherosclerotic calcifications noted at the aortic arch. Lungs are clear. No pleural effusion or pneumothorax seen. No acute or suspicious osseous finding. IMPRESSION: No active cardiopulmonary disease. No evidence of pneumonia or pulmonary edema. Aortic atherosclerosis. Electronically Signed   By: Bary Richard M.D.   On: 01/14/2017 09:38    Procedures Procedures (including critical care time)  Medications Ordered in ED Medications  sodium chloride 0.9 % bolus 1,000 mL (0 mLs Intravenous Stopped 01/14/17 1020)  gi cocktail (Maalox,Lidocaine,Donnatal) (30 mLs Oral Given 01/14/17 0921)  pantoprazole (PROTONIX) EC tablet 40 mg (40 mg Oral Given 01/14/17 0921)  ondansetron (ZOFRAN) injection 4 mg (4 mg Intravenous Given 01/14/17 0922)  dicyclomine (BENTYL) injection 20 mg (20 mg Intramuscular Given 01/14/17 8119)     Initial Impression / Assessment and Plan / ED Course  I have reviewed the triage vital signs and the nursing notes.  Pertinent labs & imaging results that were available during my care of the patient were reviewed by me and considered in my medical decision making (see chart for details).    51 year old female with a history of borderline diabetes, evaluation the emergency department 2 days ago for concern of left upper quadrant abdominal pain, with associated nausea and vomiting with negative CT scan at the time, who presents with concern for continuing left upper quadrant abdominal pain, nausea and vomiting. Patient without signs of closed lithiasis on recent CT, no right-sided tenderness, doubt cholecystitis. Labs repeated  today show normal lipase, no significant electrolyte abnormalities. Do not feel that repeat CT scan with continuation of the similar symptoms isn't indicated for her continuing left upper quadrant abdominal pain. Doubt diverticulitis, obstruction, perforation by history and exam.  Given left upper quadrant pain, obtained screening EKG and troponin which were both within normal limits. Chest x-ray shows no sign of pneumonia.  Patient's symptoms improved with Zofran, GI cocktail and Bentyl. She was able to tolerate by mouth without difficulty.  Suspect patient most likely has severe gastritis, possible peptic ulcer disease given left upper quadrant pain and emesis. Recommend twice a day PPI, prescribed Bentyl for pain. Patient discharged in stable condition with understanding of reasons to return.    Final Clinical Impressions(s) / ED Diagnoses   Final diagnoses:  Left upper quadrant pain  Acute gastritis without hemorrhage, unspecified gastritis type    New Prescriptions Discharge Medication List as of 01/14/2017 12:03 PM    START taking these medications   Details  dicyclomine (BENTYL) 20 MG tablet Take 1 tablet (20 mg total) by mouth 2 (two) times daily., Starting Sun 01/14/2017, Print    pantoprazole (PROTONIX) 20 MG tablet Take 2 tablets (40 mg total) by mouth 2 (two) times daily., Starting Sun 01/14/2017, Until Sun 01/28/2017, Print         Alvira Monday, MD 01/14/17 [phone]

## 2017-01-15 ENCOUNTER — Telehealth: Payer: Self-pay | Admitting: Family

## 2017-01-15 NOTE — Telephone Encounter (Signed)
Blood pressure was elevated and urine had blood in it at most recent ED visit. I would like to see her in the office for follow up in 1 week please.

## 2017-01-16 NOTE — Telephone Encounter (Signed)
Notified pt and schedule appt for 01/22/17 at 6:15pm.

## 2017-01-22 ENCOUNTER — Ambulatory Visit: Payer: Managed Care, Other (non HMO) | Admitting: Family

## 2017-02-05 ENCOUNTER — Other Ambulatory Visit: Payer: Self-pay | Admitting: Family Medicine

## 2017-02-05 DIAGNOSIS — Z1231 Encounter for screening mammogram for malignant neoplasm of breast: Secondary | ICD-10-CM

## 2017-02-26 ENCOUNTER — Ambulatory Visit
Admission: RE | Admit: 2017-02-26 | Discharge: 2017-02-26 | Disposition: A | Discharge status: Home / Self Care | Payer: Managed Care, Other (non HMO) | Source: Ambulatory Visit | Attending: Family Medicine | Admitting: Family Medicine

## 2017-02-26 DIAGNOSIS — Z1231 Encounter for screening mammogram for malignant neoplasm of breast: Secondary | ICD-10-CM

## 2017-02-28 ENCOUNTER — Other Ambulatory Visit: Payer: Self-pay | Admitting: Family Medicine

## 2017-02-28 DIAGNOSIS — R928 Other abnormal and inconclusive findings on diagnostic imaging of breast: Secondary | ICD-10-CM

## 2017-03-07 ENCOUNTER — Other Ambulatory Visit: Payer: Self-pay | Admitting: Family Medicine

## 2017-03-07 ENCOUNTER — Ambulatory Visit
Admission: RE | Admit: 2017-03-07 | Discharge: 2017-03-07 | Disposition: A | Discharge status: Home / Self Care | Payer: Managed Care, Other (non HMO) | Source: Ambulatory Visit | Attending: Family Medicine | Admitting: Family Medicine

## 2017-03-07 DIAGNOSIS — R928 Other abnormal and inconclusive findings on diagnostic imaging of breast: Secondary | ICD-10-CM

## 2017-03-07 DIAGNOSIS — N63 Unspecified lump in unspecified breast: Secondary | ICD-10-CM

## 2017-05-22 ENCOUNTER — Encounter: Payer: Self-pay | Admitting: Neurology

## 2017-06-28 ENCOUNTER — Emergency Department (HOSPITAL_COMMUNITY)
Admission: EM | Admit: 2017-06-28 | Discharge: 2017-06-28 | Disposition: A | Discharge status: Home / Self Care | Payer: 59 | Attending: Emergency Medicine | Admitting: Emergency Medicine

## 2017-06-28 ENCOUNTER — Encounter (HOSPITAL_COMMUNITY): Payer: Self-pay | Admitting: Emergency Medicine

## 2017-06-28 DIAGNOSIS — R1084 Generalized abdominal pain: Secondary | ICD-10-CM

## 2017-06-28 DIAGNOSIS — R112 Nausea with vomiting, unspecified: Secondary | ICD-10-CM | POA: Diagnosis not present

## 2017-06-28 DIAGNOSIS — Z79899 Other long term (current) drug therapy: Secondary | ICD-10-CM | POA: Diagnosis not present

## 2017-06-28 DIAGNOSIS — R197 Diarrhea, unspecified: Secondary | ICD-10-CM

## 2017-06-28 DIAGNOSIS — F17210 Nicotine dependence, cigarettes, uncomplicated: Secondary | ICD-10-CM | POA: Insufficient documentation

## 2017-06-28 LAB — URINALYSIS, ROUTINE W REFLEX MICROSCOPIC
BACTERIA UA: NONE SEEN
Bilirubin Urine: NEGATIVE
GLUCOSE, UA: NEGATIVE mg/dL
KETONES UR: NEGATIVE mg/dL
LEUKOCYTES UA: NEGATIVE
NITRITE: NEGATIVE
Specific Gravity, Urine: 1.023 (ref 1.005–1.030)
pH: 5 (ref 5.0–8.0)

## 2017-06-28 LAB — COMPREHENSIVE METABOLIC PANEL
ALBUMIN: 4.5 g/dL (ref 3.5–5.0)
ALT: 25 U/L (ref 14–54)
ANION GAP: 13 (ref 5–15)
AST: 34 U/L (ref 15–41)
Alkaline Phosphatase: 113 U/L (ref 38–126)
BILIRUBIN TOTAL: 0.6 mg/dL (ref 0.3–1.2)
BUN: 16 mg/dL (ref 6–20)
CHLORIDE: 105 mmol/L (ref 101–111)
CO2: 23 mmol/L (ref 22–32)
Calcium: 9.8 mg/dL (ref 8.9–10.3)
Creatinine, Ser: 0.64 mg/dL (ref 0.44–1.00)
GFR calc Af Amer: >60 mL/min (ref >60)
GFR calc non Af Amer: >60 mL/min (ref >60)
GLUCOSE: 166 mg/dL — AB (ref 65–99)
POTASSIUM: 3.8 mmol/L (ref 3.5–5.1)
Sodium: 141 mmol/L (ref 135–145)
TOTAL PROTEIN: 8.4 g/dL — AB (ref 6.5–8.1)

## 2017-06-28 LAB — CBC
HEMATOCRIT: 47.5 % — AB (ref 36.0–46.0)
Hemoglobin: 17 g/dL — ABNORMAL HIGH (ref 12.0–15.0)
MCH: 31.5 pg (ref 26.0–34.0)
MCHC: 35.8 g/dL (ref 30.0–36.0)
MCV: 88 fL (ref 78.0–100.0)
Platelets: 252 10*3/uL (ref 150–400)
RBC: 5.4 MIL/uL — ABNORMAL HIGH (ref 3.87–5.11)
RDW: 12.5 % (ref 11.5–15.5)
WBC: 12.6 10*3/uL — ABNORMAL HIGH (ref 4.0–10.5)

## 2017-06-28 LAB — LIPASE, BLOOD: LIPASE: 21 U/L (ref 11–51)

## 2017-06-28 MED ORDER — ONDANSETRON 4 MG PO TBDP: 4.0000 mg | ORAL_TABLET | Freq: Three times a day (TID) | ORAL | 0 refills | Status: AC | PRN

## 2017-06-28 MED ORDER — SODIUM CHLORIDE 0.9 % IV BOLUS (SEPSIS)
1000.0000 mL | Freq: Once | INTRAVENOUS | Status: AC
  Administered 2017-06-28: 1000 mL via INTRAVENOUS

## 2017-06-28 MED ORDER — SUCRALFATE 1 G PO TABS: 1.0000 g | ORAL_TABLET | Freq: Three times a day (TID) | ORAL | 0 refills | Status: AC

## 2017-06-28 MED ORDER — ONDANSETRON HCL 4 MG/2ML IJ SOLN
4.0000 mg | Freq: Once | INTRAMUSCULAR | Status: AC
  Administered 2017-06-28: 4 mg via INTRAVENOUS
  Filled 2017-06-28: qty 2

## 2017-06-28 MED ORDER — DICYCLOMINE HCL 10 MG PO CAPS
10.0000 mg | ORAL_CAPSULE | Freq: Once | ORAL | Status: AC
  Administered 2017-06-28: 10 mg via ORAL
  Filled 2017-06-28: qty 1

## 2017-06-28 NOTE — ED Provider Notes (Signed)
[facility]-EMERGENCY DEPT Provider Note   CSN: [id number] Arrival date & time: 06/28/17  0801     History   Chief Complaint Chief Complaint  Patient presents with  . Abdominal Pain    HPI Casey Barton is a 51 y.o. female.  Patient with history of borderline diabetes, chronic PPI use -- presents with complaint of nausea, vomiting, and diarrhea with associated generalized abdominal pain starting yesterday. Patient was seen by her primary care physician with complaint of congestion. She was started on amoxicillin for a sinus infection. Patient took 1 dose yesterday. Symptoms began after taking this dose of antibiotics. No associated fevers, chest pain, shortness of breath. Patient reports dysuria but no hematuria, increased frequency or urgency. No skin rashes. No treatments prior to arrival.      Past Medical History:  Diagnosis Date  . Allergy   . Arthritis   . Borderline diabetes 11/16/2015    Patient Active Problem List   Diagnosis Date Noted  . Borderline diabetes 11/16/2015  . Preventative health care 11/15/2015    History reviewed. No pertinent surgical history.  OB History    No data available       Home Medications    Prior to Admission medications   Medication Sig Start Date End Date Taking? Authorizing Provider  acetaminophen (TYLENOL) 500 MG tablet Take 1,000 mg by mouth every 6 (six) hours as needed.    [provider]  albuterol (PROVENTIL HFA;VENTOLIN HFA) 108 (90 Base) MCG/ACT inhaler Inhale 2 puffs into the lungs every 6 (six) hours as needed for wheezing or shortness of breath. Patient not taking: Reported on 01/14/2017 12/03/15   Sandford Craze, NP  dicyclomine (BENTYL) 20 MG tablet Take 1 tablet (20 mg total) by mouth 2 (two) times daily. 01/14/17   Alvira Monday, MD  loratadine (CLARITIN) 10 MG tablet Take 10 mg by mouth daily.    [provider]  ondansetron (ZOFRAN ODT) 4 MG disintegrating tablet Take 1 tablet (4 mg total) by  mouth every 8 (eight) hours as needed for nausea or vomiting. 01/12/17   [name], MD  pantoprazole (PROTONIX) 20 MG tablet Take 2 tablets (40 mg total) by mouth 2 (two) times daily. 01/14/17 01/28/17  Alvira Monday, MD  ranitidine (ZANTAC) 150 MG capsule Take 1 capsule (150 mg total) by mouth 2 (two) times daily. 01/12/17 01/19/17  [name], MD  sucralfate (CARAFATE) 1 GM/10ML suspension Take 10 mLs (1 g total) by mouth 4 (four) times daily -  with meals and at bedtime. 01/12/17   [name], MD    Family History Family History  Problem Relation Age of Onset  . Heart disease Father   . Kidney disease Father   . Stroke Mother   . Parkinson's disease Brother     Social History Social History  Substance Use Topics  . Smoking status: Current Every Day Smoker  . Smokeless tobacco: Not on file  . Alcohol use 0.6 oz/week    1 Standard drinks or equivalent per week     Allergies   Sulfa antibiotics   Review of Systems Review of Systems  Constitutional: Negative for fever.  HENT: Negative for rhinorrhea and sore throat.   Eyes: Negative for redness.  Respiratory: Negative for cough.   Cardiovascular: Negative for chest pain.  Gastrointestinal: Positive for abdominal pain, diarrhea, nausea and vomiting.  Genitourinary: Positive for dysuria. Negative for frequency and hematuria.  Musculoskeletal: Negative for myalgias.  Skin: Negative for rash.  Neurological: Negative for  headaches.     Physical Exam Updated Vital Signs BP (!) 185/100 (BP Location: Left Arm)   Pulse 85   Temp 98.3 F (36.8 C) (Oral)   Resp 18   Ht  (1.676 m)   Wt 86.2 kg (190 lb)   LMP 03/23/2013   SpO2 97%   BMI 30.67 kg/m   Physical Exam  Constitutional: She appears well-developed and well-nourished.  HENT:  Head: Normocephalic and atraumatic.  Mouth/Throat: Oropharynx is clear and moist.  Eyes: Conjunctivae are normal. Right eye exhibits no discharge. Left eye exhibits no  discharge.  Neck: Normal range of motion. Neck supple.  Cardiovascular: Normal rate, regular rhythm and normal heart sounds.   No murmur heard. Pulmonary/Chest: Breath sounds normal. No respiratory distress. She has no wheezes. She has no rales.  Abdominal: Soft. There is tenderness. There is no rebound and no guarding.  Neurological: She is alert.  Skin: Skin is warm and dry.  Psychiatric: She has a normal mood and affect.  Nursing note and vitals reviewed.    ED Treatments / Results  Labs (all labs ordered are listed, but only abnormal results are displayed) Labs Reviewed  COMPREHENSIVE METABOLIC PANEL - Abnormal; Notable for the following:       Result Value   Glucose, Bld 166 (*)    Total Protein 8.4 (*)    All other components within normal limits  CBC - Abnormal; Notable for the following:    WBC 12.6 (*)    RBC 5.40 (*)    Hemoglobin 17.0 (*)    HCT 47.5 (*)    All other components within normal limits  URINALYSIS, ROUTINE W REFLEX MICROSCOPIC - Abnormal; Notable for the following:    Hgb urine dipstick LARGE (*)    Protein, ur >=300 (*)    Squamous Epithelial / LPF 0-5 (*)    All other components within normal limits  LIPASE, BLOOD     Procedures Procedures (including critical care time)  Medications Ordered in ED Medications  ondansetron (ZOFRAN) injection 4 mg (4 mg Intravenous Given 06/28/17 1227)  sodium chloride 0.9 % bolus 1,000 mL (0 mLs Intravenous Stopped 06/28/17 1404)  dicyclomine (BENTYL) capsule 10 mg (10 mg Oral Given 06/28/17 1227)     Initial Impression / Assessment and Plan / ED Course  I have reviewed the triage vital signs and the nursing notes.  Pertinent labs & imaging results that were available during my care of the patient were reviewed by me and considered in my medical decision making (see chart for details).     Patient seen and examined. Work-up initiated. Medications ordered. Hematuria noted -- however this appears to be  chronic. Previous CT reviewed from 4/18, no acute findings at that time.   Vital signs reviewed and are as follows: BP (!) 185/100 (BP Location: Left Arm)   Pulse 85   Temp 98.3 F (36.8 C) (Oral)   Resp 18   Ht  (1.676 m)   Wt 86.2 kg (190 lb)   LMP 03/23/2013   SpO2 97%   BMI 30.67 kg/m   Patient with improvement after treatment. Abdomen remains soft and nontender on exam. Patient comfortable with discharge to home. Will discharge with antiemetic and Carafate. Discussed potential harms and benefits of using antibiotic for sinus infection. Patient will likely discontinue this.  The patient was urged to return to the Emergency Department immediately with worsening of current symptoms, worsening abdominal pain, persistent vomiting, blood noted in stools, fever,  or any other concerns. The patient verbalized understanding.    Final Clinical Impressions(s) / ED Diagnoses   Final diagnoses:  Nausea vomiting and diarrhea  Generalized abdominal pain   N/V/D, abdominal pain: Patient with abdominal pain, non-focal. Vitals are stable, no fever. Labs Reassuring, persistent mild leukocytosis noted. Patient has hematuria without infection. Patient encouraged to follow-up with PCP for this. She has had this in the past. Constellation of symptoms not strongly suggestive of renal colic.Imaging not felt indicated given nonfocal pain, reassuring lab work. No signs of dehydration, patient is now tolerating PO's. Lungs are clear and no signs suggestive of PNA. Low concern for appendicitis, cholecystitis, pancreatitis, ruptured viscus, UTI, kidney stone, aortic dissection, aortic aneurysm or other emergent abdominal etiology. Supportive therapy indicated with return if symptoms worsen.    New Prescriptions Discharge Medication List as of 06/28/2017  2:52 PM    START taking these medications   Details  sucralfate (CARAFATE) 1 g tablet Take 1 tablet (1 g total) by mouth 4 (four) times daily -  with  meals and at bedtime., Starting Thu 06/28/2017, Print         Renne Crigler, PA-C 06/28/17 1713    Bethann Berkshire, MD 06/29/17 1244

## 2017-06-28 NOTE — Discharge Instructions (Signed)
= °  Please read and follow all provided instructions.  Your diagnoses today include:  1. Nausea vomiting and diarrhea   2. Generalized abdominal pain     Tests performed today include:  Blood counts and electrolytes  Blood tests to check liver and kidney function  Blood tests to check pancreas function  Urine test to look for infection and pregnancy (in women)  Vital signs. See below for your results today.   Medications prescribed:   Zofran (ondansetron) - for nausea and vomiting   Carafate - for stomach upset and to protect your stomach  Take any prescribed medications only as directed.  Home care instructions:   Follow any educational materials contained in this packet.   Drink clear liquids for the next 24 hours and introduce solid foods slowly after 24 hours using the b.r.a.t. diet (Bananas, Rice, Applesauce, Toast, Yogurt).    Follow-up instructions: Please follow-up with your primary care provider in the next 3 days for further evaluation of your symptoms. If you are not feeling better in 48 hours you may have a condition that is more serious and you need re-evaluation.   Return instructions:  SEEK IMMEDIATE MEDICAL ATTENTION IF:  If you have pain that does not go away or becomes severe   A temperature above 101F develops   Repeated vomiting occurs (multiple episodes)   If you have pain that becomes localized to portions of the abdomen. The right side could possibly be appendicitis. In an adult, the left lower portion of the abdomen could be colitis or diverticulitis.   Blood is being passed in stools or vomit (bright red or black tarry stools)   You develop chest pain, difficulty breathing, dizziness or fainting, or become confused, poorly responsive, or inconsolable (young children)  If you have any other emergent concerns regarding your health  Additional Information: Abdominal (belly) pain can be caused by many things. Your caregiver performed an  examination and possibly ordered blood/urine tests and imaging (CT scan, x-rays, ultrasound). Many cases can be observed and treated at home after initial evaluation in the emergency department. Even though you are being discharged home, abdominal pain can be unpredictable. Therefore, you need a repeated exam if your pain does not resolve, returns, or worsens. Most patients with abdominal pain don't have to be admitted to the hospital or have surgery, but serious problems like appendicitis and gallbladder attacks can start out as nonspecific pain. Many abdominal conditions cannot be diagnosed in one visit, so follow-up evaluations are very important.  Your vital signs today were: BP (!) 183/93 (BP Location: Left Arm)    Pulse 82    Temp 99.3 F (37.4 C) (Oral)    Resp 18    Ht  (1.676 m)    Wt 86.2 kg (190 lb)    LMP 03/23/2013    SpO2 98%    BMI 30.67 kg/m  If your blood pressure (bp) was elevated above 135/85 this visit, please have this repeated by your doctor within one month. --------------

## 2017-06-28 NOTE — ED Triage Notes (Signed)
Pt complaint of generalized abdominal pain with associated n/v/d, burning with urination. Recently treated for sinus infection but unable "to take medicine because of throwing up."

## 2017-08-20 ENCOUNTER — Ambulatory Visit: Payer: Managed Care, Other (non HMO) | Admitting: Neurology

## 2017-09-10 ENCOUNTER — Inpatient Hospital Stay
Admission: RE | Admit: 2017-09-10 | Discharge: 2017-09-10 | Disposition: A | Discharge status: Home / Self Care | Payer: Managed Care, Other (non HMO) | Source: Ambulatory Visit | Attending: Family Medicine | Admitting: Family Medicine

## 2018-05-03 ENCOUNTER — Other Ambulatory Visit: Payer: Self-pay | Admitting: Family Medicine

## 2018-05-03 ENCOUNTER — Other Ambulatory Visit (HOSPITAL_COMMUNITY)
Admission: RE | Admit: 2018-05-03 | Discharge: 2018-05-03 | Disposition: A | Discharge status: Home / Self Care | Payer: Managed Care, Other (non HMO) | Source: Ambulatory Visit | Attending: Family Medicine | Admitting: Family Medicine

## 2018-05-03 DIAGNOSIS — Z124 Encounter for screening for malignant neoplasm of cervix: Secondary | ICD-10-CM | POA: Insufficient documentation

## 2018-05-08 LAB — CYTOLOGY - PAP
Adequacy: ABSENT — AB
Diagnosis: UNDETERMINED — AB
HPV: NOT DETECTED

## 2018-05-09 ENCOUNTER — Other Ambulatory Visit: Payer: Self-pay | Admitting: Family Medicine

## 2018-05-09 DIAGNOSIS — R928 Other abnormal and inconclusive findings on diagnostic imaging of breast: Secondary | ICD-10-CM

## 2018-05-10 ENCOUNTER — Other Ambulatory Visit: Payer: Self-pay | Admitting: Family Medicine

## 2018-05-10 DIAGNOSIS — N6320 Unspecified lump in the left breast, unspecified quadrant: Secondary | ICD-10-CM

## 2018-05-10 DIAGNOSIS — R928 Other abnormal and inconclusive findings on diagnostic imaging of breast: Secondary | ICD-10-CM

## 2018-05-10 DIAGNOSIS — N6002 Solitary cyst of left breast: Secondary | ICD-10-CM

## 2018-06-06 ENCOUNTER — Other Ambulatory Visit: Payer: Managed Care, Other (non HMO)

## 2020-03-02 ENCOUNTER — Ambulatory Visit (INDEPENDENT_AMBULATORY_CARE_PROVIDER_SITE_OTHER): Payer: Managed Care, Other (non HMO) | Admitting: Otolaryngology

## 2020-04-13 ENCOUNTER — Other Ambulatory Visit (HOSPITAL_COMMUNITY)
Admission: RE | Admit: 2020-04-13 | Discharge: 2020-04-13 | Disposition: A | Discharge status: Home / Self Care | Payer: 59 | Source: Ambulatory Visit | Attending: Family Medicine | Admitting: Family Medicine

## 2020-04-13 DIAGNOSIS — Z124 Encounter for screening for malignant neoplasm of cervix: Secondary | ICD-10-CM | POA: Insufficient documentation

## 2020-04-15 ENCOUNTER — Other Ambulatory Visit (HOSPITAL_COMMUNITY): Payer: Self-pay | Admitting: Family Medicine

## 2020-04-15 ENCOUNTER — Other Ambulatory Visit: Payer: Self-pay | Admitting: Family Medicine

## 2020-04-15 DIAGNOSIS — R928 Other abnormal and inconclusive findings on diagnostic imaging of breast: Secondary | ICD-10-CM

## 2020-04-15 DIAGNOSIS — R0989 Other specified symptoms and signs involving the circulatory and respiratory systems: Secondary | ICD-10-CM

## 2020-04-15 LAB — CYTOLOGY - PAP
Comment: NEGATIVE
Diagnosis: NEGATIVE
High risk HPV: NEGATIVE

## 2020-04-19 ENCOUNTER — Other Ambulatory Visit: Payer: Self-pay | Admitting: Family Medicine

## 2020-04-19 DIAGNOSIS — F17200 Nicotine dependence, unspecified, uncomplicated: Secondary | ICD-10-CM

## 2020-04-21 ENCOUNTER — Ambulatory Visit (HOSPITAL_COMMUNITY): Payer: 59

## 2020-05-12 ENCOUNTER — Other Ambulatory Visit: Payer: 59

## 2020-11-02 DIAGNOSIS — L600 Ingrowing nail: Secondary | ICD-10-CM | POA: Diagnosis not present

## 2020-11-02 DIAGNOSIS — M7732 Calcaneal spur, left foot: Secondary | ICD-10-CM | POA: Diagnosis not present

## 2020-11-02 DIAGNOSIS — M79672 Pain in left foot: Secondary | ICD-10-CM | POA: Diagnosis not present

## 2020-11-18 DIAGNOSIS — M722 Plantar fascial fibromatosis: Secondary | ICD-10-CM | POA: Diagnosis not present

## 2020-11-18 DIAGNOSIS — M71572 Other bursitis, not elsewhere classified, left ankle and foot: Secondary | ICD-10-CM | POA: Diagnosis not present

## 2020-11-23 DIAGNOSIS — U071 COVID-19: Secondary | ICD-10-CM | POA: Diagnosis not present

## 2020-11-23 DIAGNOSIS — Z1159 Encounter for screening for other viral diseases: Secondary | ICD-10-CM | POA: Diagnosis not present

## 2020-11-23 DIAGNOSIS — J020 Streptococcal pharyngitis: Secondary | ICD-10-CM | POA: Diagnosis not present

## 2020-11-27 DIAGNOSIS — Z20822 Contact with and (suspected) exposure to covid-19: Secondary | ICD-10-CM | POA: Diagnosis not present

## 2020-12-08 DIAGNOSIS — E119 Type 2 diabetes mellitus without complications: Secondary | ICD-10-CM | POA: Diagnosis not present

## 2020-12-08 DIAGNOSIS — U071 COVID-19: Secondary | ICD-10-CM | POA: Diagnosis not present

## 2020-12-08 DIAGNOSIS — R946 Abnormal results of thyroid function studies: Secondary | ICD-10-CM | POA: Diagnosis not present

## 2020-12-08 DIAGNOSIS — Z1159 Encounter for screening for other viral diseases: Secondary | ICD-10-CM | POA: Diagnosis not present

## 2020-12-08 DIAGNOSIS — R432 Parageusia: Secondary | ICD-10-CM | POA: Diagnosis not present

## 2020-12-08 DIAGNOSIS — R7989 Other specified abnormal findings of blood chemistry: Secondary | ICD-10-CM | POA: Diagnosis not present

## 2020-12-08 DIAGNOSIS — Z13220 Encounter for screening for lipoid disorders: Secondary | ICD-10-CM | POA: Diagnosis not present

## 2020-12-08 DIAGNOSIS — Z0000 Encounter for general adult medical examination without abnormal findings: Secondary | ICD-10-CM | POA: Diagnosis not present

## 2020-12-08 DIAGNOSIS — R7303 Prediabetes: Secondary | ICD-10-CM | POA: Diagnosis not present

## 2020-12-15 DIAGNOSIS — L600 Ingrowing nail: Secondary | ICD-10-CM | POA: Diagnosis not present

## 2020-12-15 DIAGNOSIS — B351 Tinea unguium: Secondary | ICD-10-CM | POA: Diagnosis not present

## 2020-12-15 DIAGNOSIS — M79675 Pain in left toe(s): Secondary | ICD-10-CM | POA: Diagnosis not present

## 2020-12-15 DIAGNOSIS — M79674 Pain in right toe(s): Secondary | ICD-10-CM | POA: Diagnosis not present

## 2021-01-09 DIAGNOSIS — H9202 Otalgia, left ear: Secondary | ICD-10-CM | POA: Diagnosis not present

## 2021-01-09 DIAGNOSIS — B372 Candidiasis of skin and nail: Secondary | ICD-10-CM | POA: Diagnosis not present

## 2021-01-09 DIAGNOSIS — L089 Local infection of the skin and subcutaneous tissue, unspecified: Secondary | ICD-10-CM | POA: Diagnosis not present

## 2021-01-09 DIAGNOSIS — L918 Other hypertrophic disorders of the skin: Secondary | ICD-10-CM | POA: Diagnosis not present

## 2021-03-15 DIAGNOSIS — R109 Unspecified abdominal pain: Secondary | ICD-10-CM | POA: Diagnosis not present

## 2021-03-15 DIAGNOSIS — R1110 Vomiting, unspecified: Secondary | ICD-10-CM | POA: Diagnosis not present

## 2021-05-05 DIAGNOSIS — E782 Mixed hyperlipidemia: Secondary | ICD-10-CM | POA: Diagnosis not present

## 2021-05-05 DIAGNOSIS — R221 Localized swelling, mass and lump, neck: Secondary | ICD-10-CM | POA: Diagnosis not present

## 2021-05-05 DIAGNOSIS — I10 Essential (primary) hypertension: Secondary | ICD-10-CM | POA: Diagnosis not present

## 2021-05-11 DIAGNOSIS — R221 Localized swelling, mass and lump, neck: Secondary | ICD-10-CM | POA: Diagnosis not present

## 2021-05-11 DIAGNOSIS — E041 Nontoxic single thyroid nodule: Secondary | ICD-10-CM | POA: Diagnosis not present

## 2021-05-13 DIAGNOSIS — L0231 Cutaneous abscess of buttock: Secondary | ICD-10-CM | POA: Diagnosis not present

## 2021-05-17 ENCOUNTER — Other Ambulatory Visit: Payer: Self-pay | Admitting: Family Medicine

## 2021-05-17 DIAGNOSIS — R221 Localized swelling, mass and lump, neck: Secondary | ICD-10-CM

## 2021-05-20 DIAGNOSIS — E559 Vitamin D deficiency, unspecified: Secondary | ICD-10-CM | POA: Diagnosis not present

## 2021-05-20 DIAGNOSIS — Z0000 Encounter for general adult medical examination without abnormal findings: Secondary | ICD-10-CM | POA: Diagnosis not present

## 2021-05-20 DIAGNOSIS — E1169 Type 2 diabetes mellitus with other specified complication: Secondary | ICD-10-CM | POA: Diagnosis not present

## 2021-05-20 DIAGNOSIS — E782 Mixed hyperlipidemia: Secondary | ICD-10-CM | POA: Diagnosis not present

## 2021-05-24 ENCOUNTER — Other Ambulatory Visit: Payer: Self-pay

## 2021-05-24 ENCOUNTER — Ambulatory Visit
Admission: RE | Admit: 2021-05-24 | Discharge: 2021-05-24 | Disposition: A | Discharge status: Home / Self Care | Payer: BC Managed Care – PPO | Source: Ambulatory Visit | Attending: Family Medicine | Admitting: Family Medicine

## 2021-05-24 DIAGNOSIS — I6523 Occlusion and stenosis of bilateral carotid arteries: Secondary | ICD-10-CM | POA: Diagnosis not present

## 2021-05-24 DIAGNOSIS — R221 Localized swelling, mass and lump, neck: Secondary | ICD-10-CM

## 2021-05-24 DIAGNOSIS — E041 Nontoxic single thyroid nodule: Secondary | ICD-10-CM | POA: Diagnosis not present

## 2021-05-24 DIAGNOSIS — M47812 Spondylosis without myelopathy or radiculopathy, cervical region: Secondary | ICD-10-CM | POA: Diagnosis not present

## 2021-05-24 DIAGNOSIS — Z0389 Encounter for observation for other suspected diseases and conditions ruled out: Secondary | ICD-10-CM | POA: Diagnosis not present

## 2021-05-24 MED ORDER — IOPAMIDOL (ISOVUE-300) INJECTION 61%
75.0000 mL | Freq: Once | INTRAVENOUS | Status: AC | PRN
  Administered 2021-05-24: 75 mL via INTRAVENOUS

## 2021-06-09 DIAGNOSIS — S80862A Insect bite (nonvenomous), left lower leg, initial encounter: Secondary | ICD-10-CM | POA: Diagnosis not present

## 2021-06-09 DIAGNOSIS — L03116 Cellulitis of left lower limb: Secondary | ICD-10-CM | POA: Diagnosis not present

## 2021-06-14 DIAGNOSIS — D3131 Benign neoplasm of right choroid: Secondary | ICD-10-CM | POA: Diagnosis not present

## 2021-06-14 DIAGNOSIS — E119 Type 2 diabetes mellitus without complications: Secondary | ICD-10-CM | POA: Diagnosis not present

## 2021-06-20 DIAGNOSIS — H6592 Unspecified nonsuppurative otitis media, left ear: Secondary | ICD-10-CM | POA: Diagnosis not present

## 2021-06-21 DIAGNOSIS — R519 Headache, unspecified: Secondary | ICD-10-CM | POA: Diagnosis not present

## 2021-06-21 DIAGNOSIS — H6090 Unspecified otitis externa, unspecified ear: Secondary | ICD-10-CM | POA: Diagnosis not present

## 2021-06-21 DIAGNOSIS — Z03818 Encounter for observation for suspected exposure to other biological agents ruled out: Secondary | ICD-10-CM | POA: Diagnosis not present

## 2021-07-07 DIAGNOSIS — H43811 Vitreous degeneration, right eye: Secondary | ICD-10-CM | POA: Diagnosis not present

## 2021-07-12 DIAGNOSIS — E113291 Type 2 diabetes mellitus with mild nonproliferative diabetic retinopathy without macular edema, right eye: Secondary | ICD-10-CM | POA: Diagnosis not present

## 2021-07-12 DIAGNOSIS — H43822 Vitreomacular adhesion, left eye: Secondary | ICD-10-CM | POA: Diagnosis not present

## 2021-07-12 DIAGNOSIS — H43391 Other vitreous opacities, right eye: Secondary | ICD-10-CM | POA: Diagnosis not present

## 2021-07-12 DIAGNOSIS — H4311 Vitreous hemorrhage, right eye: Secondary | ICD-10-CM | POA: Diagnosis not present

## 2021-07-14 DIAGNOSIS — H35051 Retinal neovascularization, unspecified, right eye: Secondary | ICD-10-CM | POA: Diagnosis not present

## 2021-07-14 DIAGNOSIS — E113591 Type 2 diabetes mellitus with proliferative diabetic retinopathy without macular edema, right eye: Secondary | ICD-10-CM | POA: Diagnosis not present

## 2021-07-14 DIAGNOSIS — H4311 Vitreous hemorrhage, right eye: Secondary | ICD-10-CM | POA: Diagnosis not present

## 2021-07-14 DIAGNOSIS — H43311 Vitreous membranes and strands, right eye: Secondary | ICD-10-CM | POA: Diagnosis not present

## 2021-07-14 DIAGNOSIS — H26491 Other secondary cataract, right eye: Secondary | ICD-10-CM | POA: Diagnosis not present

## 2021-07-15 DIAGNOSIS — H31091 Other chorioretinal scars, right eye: Secondary | ICD-10-CM | POA: Diagnosis not present

## 2021-07-15 DIAGNOSIS — H4311 Vitreous hemorrhage, right eye: Secondary | ICD-10-CM | POA: Diagnosis not present

## 2021-07-22 DIAGNOSIS — E113593 Type 2 diabetes mellitus with proliferative diabetic retinopathy without macular edema, bilateral: Secondary | ICD-10-CM | POA: Diagnosis not present

## 2021-08-12 DIAGNOSIS — E113593 Type 2 diabetes mellitus with proliferative diabetic retinopathy without macular edema, bilateral: Secondary | ICD-10-CM | POA: Diagnosis not present

## 2021-08-12 DIAGNOSIS — H31091 Other chorioretinal scars, right eye: Secondary | ICD-10-CM | POA: Diagnosis not present

## 2021-09-20 DIAGNOSIS — B349 Viral infection, unspecified: Secondary | ICD-10-CM | POA: Diagnosis not present

## 2021-09-20 DIAGNOSIS — J069 Acute upper respiratory infection, unspecified: Secondary | ICD-10-CM | POA: Diagnosis not present

## 2021-09-23 DIAGNOSIS — Z20828 Contact with and (suspected) exposure to other viral communicable diseases: Secondary | ICD-10-CM | POA: Diagnosis not present

## 2021-09-23 DIAGNOSIS — J019 Acute sinusitis, unspecified: Secondary | ICD-10-CM | POA: Diagnosis not present

## 2022-02-12 DIAGNOSIS — L0231 Cutaneous abscess of buttock: Secondary | ICD-10-CM | POA: Diagnosis not present

## 2022-02-12 DIAGNOSIS — L02212 Cutaneous abscess of back [any part, except buttock]: Secondary | ICD-10-CM | POA: Diagnosis not present

## 2022-03-15 DIAGNOSIS — M47816 Spondylosis without myelopathy or radiculopathy, lumbar region: Secondary | ICD-10-CM | POA: Diagnosis not present

## 2022-03-15 DIAGNOSIS — R109 Unspecified abdominal pain: Secondary | ICD-10-CM | POA: Diagnosis not present

## 2022-03-15 DIAGNOSIS — K760 Fatty (change of) liver, not elsewhere classified: Secondary | ICD-10-CM | POA: Diagnosis not present

## 2022-03-17 DIAGNOSIS — Z79899 Other long term (current) drug therapy: Secondary | ICD-10-CM | POA: Diagnosis not present

## 2022-03-17 DIAGNOSIS — M940 Chondrocostal junction syndrome [Tietze]: Secondary | ICD-10-CM | POA: Diagnosis not present

## 2022-03-17 DIAGNOSIS — E782 Mixed hyperlipidemia: Secondary | ICD-10-CM | POA: Diagnosis not present

## 2022-03-17 DIAGNOSIS — R0781 Pleurodynia: Secondary | ICD-10-CM | POA: Diagnosis not present

## 2022-04-10 ENCOUNTER — Other Ambulatory Visit: Payer: Self-pay | Admitting: Family Medicine

## 2022-04-13 ENCOUNTER — Other Ambulatory Visit: Payer: Self-pay | Admitting: Family Medicine

## 2022-04-13 DIAGNOSIS — M899 Disorder of bone, unspecified: Secondary | ICD-10-CM

## 2022-04-18 DIAGNOSIS — E1165 Type 2 diabetes mellitus with hyperglycemia: Secondary | ICD-10-CM | POA: Diagnosis not present

## 2022-04-18 DIAGNOSIS — E559 Vitamin D deficiency, unspecified: Secondary | ICD-10-CM | POA: Diagnosis not present

## 2022-04-18 DIAGNOSIS — L03317 Cellulitis of buttock: Secondary | ICD-10-CM | POA: Diagnosis not present

## 2022-04-18 DIAGNOSIS — I10 Essential (primary) hypertension: Secondary | ICD-10-CM | POA: Diagnosis not present

## 2022-04-21 ENCOUNTER — Ambulatory Visit: Payer: BC Managed Care – PPO | Admitting: Family Medicine

## 2022-05-10 DIAGNOSIS — H9203 Otalgia, bilateral: Secondary | ICD-10-CM | POA: Diagnosis not present

## 2022-05-10 DIAGNOSIS — H6693 Otitis media, unspecified, bilateral: Secondary | ICD-10-CM | POA: Diagnosis not present

## 2022-05-10 DIAGNOSIS — Z20828 Contact with and (suspected) exposure to other viral communicable diseases: Secondary | ICD-10-CM | POA: Diagnosis not present

## 2022-05-10 DIAGNOSIS — R519 Headache, unspecified: Secondary | ICD-10-CM | POA: Diagnosis not present

## 2022-07-27 DIAGNOSIS — R062 Wheezing: Secondary | ICD-10-CM | POA: Diagnosis not present

## 2022-07-27 DIAGNOSIS — J208 Acute bronchitis due to other specified organisms: Secondary | ICD-10-CM | POA: Diagnosis not present

## 2022-07-27 DIAGNOSIS — R051 Acute cough: Secondary | ICD-10-CM | POA: Diagnosis not present

## 2022-08-18 DIAGNOSIS — L600 Ingrowing nail: Secondary | ICD-10-CM | POA: Diagnosis not present

## 2022-10-30 IMAGING — CT CT NECK W/ CM
3 of 4 series · 6 of 14 positions shown, 7 images · IV contrast (iopamidol)
Comparison: Correlation made with prior ultrasound.

CLINICAL DATA: Possible left neck mass on ultrasound

EXAM:
CT NECK WITH CONTRAST
TECHNIQUE: Multidetector CT imaging of the neck was performed using the
standard protocol following the bolus administration of intravenous
contrast.
CONTRAST:  75mL UQUVVD-0SS IOPAMIDOL (UQUVVD-0SS) INJECTION 61%

[Series 3: neck · axial · 0.37mm/px · z∈[+114,+204]mm · 2 of 136 slices shown, 3 images]
[im 46/136  soft-tissue]
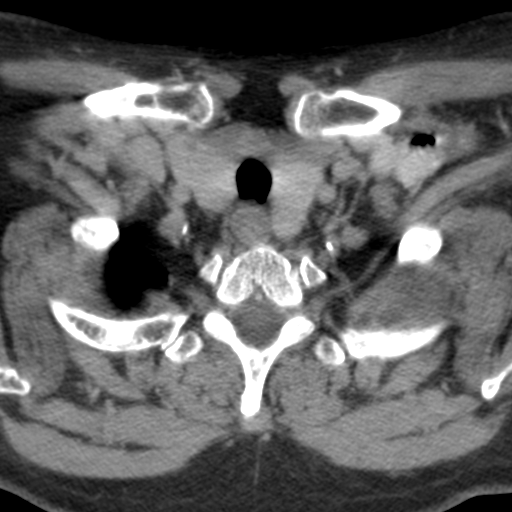
[im 46/136  bone]
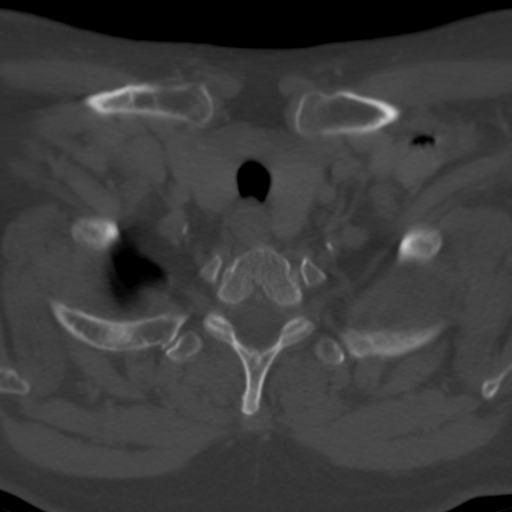
[im 91/136  bone]
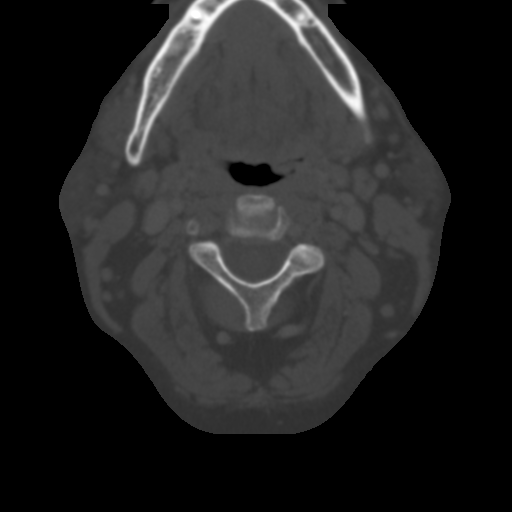

[Series 8: angled axial-oropharynx · axial · 0.37mm/px · z∈[+114,+204]mm · 2 of 135 slices shown]
[im 45/135  bone]
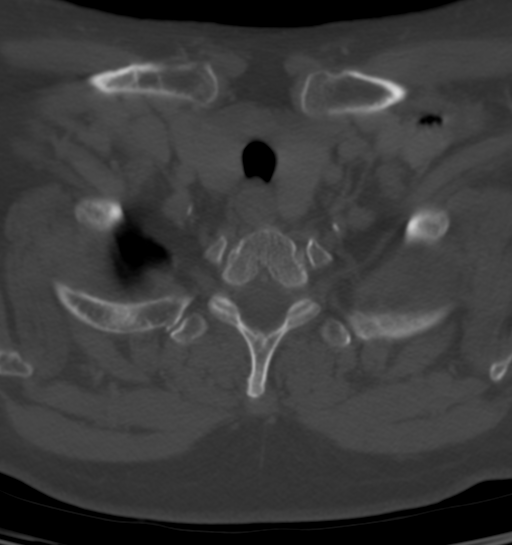
[im 90/135  bone]
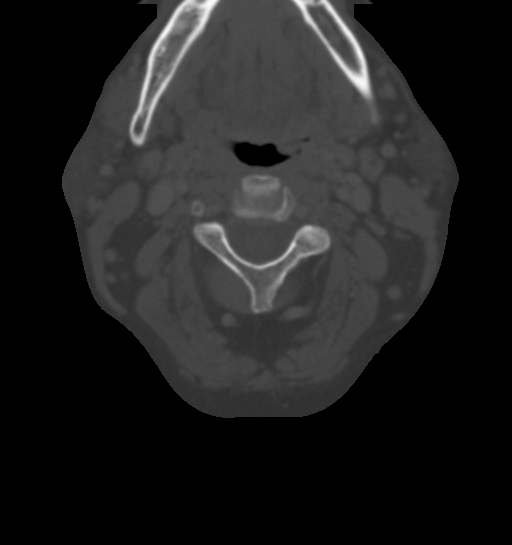

[Series 9: angled (person_name) · axial · 0.37mm/px · z∈[+114,+204]mm · 2 of 135 slices shown]
[im 45/135  bone]
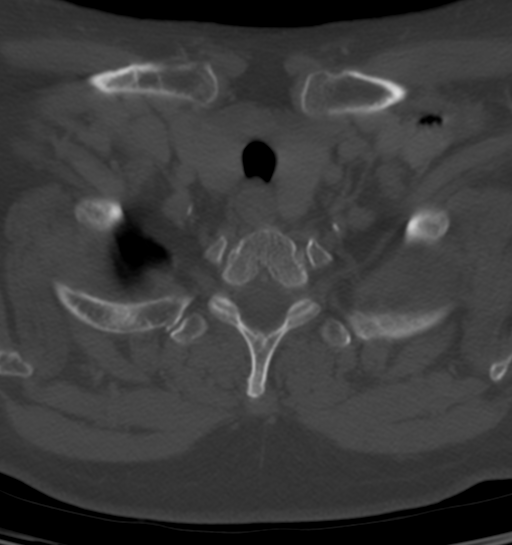
[im 90/135  bone]
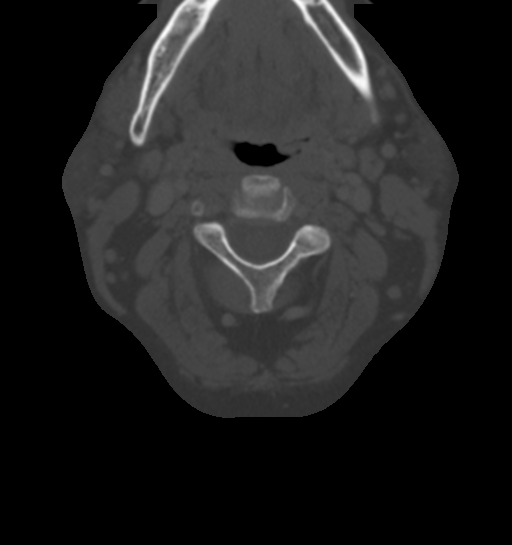

[6 of 14 positions shown; findings below may reference images not displayed]

FINDINGS: Pharynx and larynx: Unremarkable.  No mass or swelling.

Salivary glands: Parotid and submandibular glands are unremarkable.

Thyroid: Right thyroid nodule recently evaluated on ultrasound.

Lymph nodes: No enlarged or abnormal density nodes.

Vascular: Major neck vessels are patent. There is calcified plaque
at the right greater than left ICA origins.

Limited intracranial: No abnormal enhancement.

Visualized orbits: Bilateral lens replacements.

Mastoids and visualized paranasal sinuses: No significant
opacification.

Skeleton: Mild cervical spine degenerative changes.

Upper chest: No apical lung mass.

Other: Skin marker overlies the left sternocleidomastoid at the
level of the inferior hyoid and superior thyroid cartilage. No
underlying mass or enlarged lymph node identified.
IMPRESSION: No neck mass or adenopathy.

## 2023-01-19 DIAGNOSIS — E7800 Pure hypercholesterolemia, unspecified: Secondary | ICD-10-CM | POA: Diagnosis not present

## 2023-01-19 DIAGNOSIS — E1169 Type 2 diabetes mellitus with other specified complication: Secondary | ICD-10-CM | POA: Insufficient documentation

## 2023-01-19 DIAGNOSIS — E119 Type 2 diabetes mellitus without complications: Secondary | ICD-10-CM | POA: Insufficient documentation

## 2023-01-19 DIAGNOSIS — E782 Mixed hyperlipidemia: Secondary | ICD-10-CM | POA: Insufficient documentation

## 2023-01-19 DIAGNOSIS — Z1231 Encounter for screening mammogram for malignant neoplasm of breast: Secondary | ICD-10-CM | POA: Diagnosis not present

## 2023-01-19 DIAGNOSIS — E669 Obesity, unspecified: Secondary | ICD-10-CM | POA: Diagnosis not present

## 2023-01-27 DIAGNOSIS — G43119 Migraine with aura, intractable, without status migrainosus: Secondary | ICD-10-CM | POA: Diagnosis not present

## 2023-02-14 DIAGNOSIS — E133513 Other specified diabetes mellitus with proliferative diabetic retinopathy with macular edema, bilateral: Secondary | ICD-10-CM | POA: Diagnosis not present

## 2023-02-14 DIAGNOSIS — H43813 Vitreous degeneration, bilateral: Secondary | ICD-10-CM | POA: Diagnosis not present

## 2023-02-14 DIAGNOSIS — H4312 Vitreous hemorrhage, left eye: Secondary | ICD-10-CM | POA: Diagnosis not present

## 2023-02-14 DIAGNOSIS — H43822 Vitreomacular adhesion, left eye: Secondary | ICD-10-CM | POA: Diagnosis not present

## 2023-02-20 DIAGNOSIS — E113592 Type 2 diabetes mellitus with proliferative diabetic retinopathy without macular edema, left eye: Secondary | ICD-10-CM | POA: Diagnosis not present

## 2023-02-20 DIAGNOSIS — H4312 Vitreous hemorrhage, left eye: Secondary | ICD-10-CM | POA: Diagnosis not present

## 2023-02-20 DIAGNOSIS — H43813 Vitreous degeneration, bilateral: Secondary | ICD-10-CM | POA: Diagnosis not present

## 2023-02-22 DIAGNOSIS — E113512 Type 2 diabetes mellitus with proliferative diabetic retinopathy with macular edema, left eye: Secondary | ICD-10-CM | POA: Diagnosis not present

## 2023-02-22 DIAGNOSIS — E113511 Type 2 diabetes mellitus with proliferative diabetic retinopathy with macular edema, right eye: Secondary | ICD-10-CM | POA: Diagnosis not present

## 2023-02-22 DIAGNOSIS — H4312 Vitreous hemorrhage, left eye: Secondary | ICD-10-CM | POA: Diagnosis not present

## 2023-02-23 DIAGNOSIS — H4312 Vitreous hemorrhage, left eye: Secondary | ICD-10-CM | POA: Diagnosis not present

## 2023-02-23 DIAGNOSIS — Z9889 Other specified postprocedural states: Secondary | ICD-10-CM | POA: Diagnosis not present

## 2023-02-23 DIAGNOSIS — H43392 Other vitreous opacities, left eye: Secondary | ICD-10-CM | POA: Diagnosis not present

## 2023-02-23 DIAGNOSIS — E113592 Type 2 diabetes mellitus with proliferative diabetic retinopathy without macular edema, left eye: Secondary | ICD-10-CM | POA: Diagnosis not present

## 2023-03-02 DIAGNOSIS — H4312 Vitreous hemorrhage, left eye: Secondary | ICD-10-CM | POA: Diagnosis not present

## 2023-03-02 DIAGNOSIS — Z9889 Other specified postprocedural states: Secondary | ICD-10-CM | POA: Diagnosis not present

## 2023-03-02 DIAGNOSIS — E113592 Type 2 diabetes mellitus with proliferative diabetic retinopathy without macular edema, left eye: Secondary | ICD-10-CM | POA: Diagnosis not present

## 2023-03-02 DIAGNOSIS — H43392 Other vitreous opacities, left eye: Secondary | ICD-10-CM | POA: Diagnosis not present

## 2023-03-18 DIAGNOSIS — R519 Headache, unspecified: Secondary | ICD-10-CM | POA: Diagnosis not present

## 2023-03-18 DIAGNOSIS — R051 Acute cough: Secondary | ICD-10-CM | POA: Diagnosis not present

## 2023-03-18 DIAGNOSIS — R0981 Nasal congestion: Secondary | ICD-10-CM | POA: Diagnosis not present

## 2023-03-18 DIAGNOSIS — J324 Chronic pansinusitis: Secondary | ICD-10-CM | POA: Diagnosis not present

## 2023-03-23 DIAGNOSIS — H4312 Vitreous hemorrhage, left eye: Secondary | ICD-10-CM | POA: Diagnosis not present

## 2023-03-23 DIAGNOSIS — E113592 Type 2 diabetes mellitus with proliferative diabetic retinopathy without macular edema, left eye: Secondary | ICD-10-CM | POA: Diagnosis not present

## 2023-03-23 DIAGNOSIS — Z9889 Other specified postprocedural states: Secondary | ICD-10-CM | POA: Diagnosis not present

## 2023-03-23 DIAGNOSIS — H43392 Other vitreous opacities, left eye: Secondary | ICD-10-CM | POA: Diagnosis not present

## 2023-04-09 DIAGNOSIS — Z23 Encounter for immunization: Secondary | ICD-10-CM | POA: Diagnosis not present

## 2023-04-09 DIAGNOSIS — K219 Gastro-esophageal reflux disease without esophagitis: Secondary | ICD-10-CM | POA: Diagnosis not present

## 2023-04-09 DIAGNOSIS — R1010 Upper abdominal pain, unspecified: Secondary | ICD-10-CM | POA: Diagnosis not present

## 2023-04-09 DIAGNOSIS — R030 Elevated blood-pressure reading, without diagnosis of hypertension: Secondary | ICD-10-CM | POA: Diagnosis not present

## 2023-04-09 DIAGNOSIS — W5501XA Bitten by cat, initial encounter: Secondary | ICD-10-CM | POA: Diagnosis not present

## 2023-04-09 DIAGNOSIS — S81851A Open bite, right lower leg, initial encounter: Secondary | ICD-10-CM | POA: Diagnosis not present

## 2023-04-09 DIAGNOSIS — M7989 Other specified soft tissue disorders: Secondary | ICD-10-CM | POA: Diagnosis not present

## 2023-04-13 DIAGNOSIS — R112 Nausea with vomiting, unspecified: Secondary | ICD-10-CM | POA: Diagnosis not present

## 2023-04-13 DIAGNOSIS — Z7984 Long term (current) use of oral hypoglycemic drugs: Secondary | ICD-10-CM | POA: Diagnosis not present

## 2023-04-13 DIAGNOSIS — R1010 Upper abdominal pain, unspecified: Secondary | ICD-10-CM | POA: Diagnosis not present

## 2023-04-13 DIAGNOSIS — R1011 Right upper quadrant pain: Secondary | ICD-10-CM | POA: Diagnosis not present

## 2023-04-13 DIAGNOSIS — R1013 Epigastric pain: Secondary | ICD-10-CM | POA: Diagnosis not present

## 2023-04-13 DIAGNOSIS — K760 Fatty (change of) liver, not elsewhere classified: Secondary | ICD-10-CM | POA: Diagnosis not present

## 2023-04-13 DIAGNOSIS — Z79899 Other long term (current) drug therapy: Secondary | ICD-10-CM | POA: Diagnosis not present

## 2023-04-13 DIAGNOSIS — R109 Unspecified abdominal pain: Secondary | ICD-10-CM | POA: Diagnosis not present

## 2023-04-13 DIAGNOSIS — K219 Gastro-esophageal reflux disease without esophagitis: Secondary | ICD-10-CM | POA: Diagnosis not present

## 2023-04-13 DIAGNOSIS — R030 Elevated blood-pressure reading, without diagnosis of hypertension: Secondary | ICD-10-CM | POA: Diagnosis not present

## 2023-04-13 DIAGNOSIS — E119 Type 2 diabetes mellitus without complications: Secondary | ICD-10-CM | POA: Diagnosis not present

## 2023-04-17 DIAGNOSIS — W5501XS Bitten by cat, sequela: Secondary | ICD-10-CM | POA: Diagnosis not present

## 2023-04-17 DIAGNOSIS — L03115 Cellulitis of right lower limb: Secondary | ICD-10-CM | POA: Diagnosis not present

## 2023-04-20 DIAGNOSIS — W5501XS Bitten by cat, sequela: Secondary | ICD-10-CM | POA: Diagnosis not present

## 2023-04-20 DIAGNOSIS — L03115 Cellulitis of right lower limb: Secondary | ICD-10-CM | POA: Diagnosis not present

## 2023-06-20 DIAGNOSIS — R1013 Epigastric pain: Secondary | ICD-10-CM | POA: Diagnosis not present

## 2023-06-20 DIAGNOSIS — Z6826 Body mass index (BMI) 26.0-26.9, adult: Secondary | ICD-10-CM | POA: Diagnosis not present

## 2023-06-20 DIAGNOSIS — R110 Nausea: Secondary | ICD-10-CM | POA: Diagnosis not present

## 2023-06-22 DIAGNOSIS — H31093 Other chorioretinal scars, bilateral: Secondary | ICD-10-CM | POA: Diagnosis not present

## 2023-06-22 DIAGNOSIS — E113593 Type 2 diabetes mellitus with proliferative diabetic retinopathy without macular edema, bilateral: Secondary | ICD-10-CM | POA: Diagnosis not present

## 2023-06-22 DIAGNOSIS — H43811 Vitreous degeneration, right eye: Secondary | ICD-10-CM | POA: Diagnosis not present

## 2023-06-29 DIAGNOSIS — E782 Mixed hyperlipidemia: Secondary | ICD-10-CM | POA: Diagnosis not present

## 2023-06-29 DIAGNOSIS — E1165 Type 2 diabetes mellitus with hyperglycemia: Secondary | ICD-10-CM | POA: Diagnosis not present

## 2023-06-29 DIAGNOSIS — R519 Headache, unspecified: Secondary | ICD-10-CM | POA: Diagnosis not present

## 2023-06-29 DIAGNOSIS — I10 Essential (primary) hypertension: Secondary | ICD-10-CM | POA: Diagnosis not present

## 2023-06-29 DIAGNOSIS — R0981 Nasal congestion: Secondary | ICD-10-CM | POA: Diagnosis not present

## 2023-07-01 DIAGNOSIS — R042 Hemoptysis: Secondary | ICD-10-CM | POA: Diagnosis not present

## 2023-07-01 DIAGNOSIS — J209 Acute bronchitis, unspecified: Secondary | ICD-10-CM | POA: Diagnosis not present

## 2023-10-11 DIAGNOSIS — R300 Dysuria: Secondary | ICD-10-CM | POA: Diagnosis not present

## 2023-11-19 ENCOUNTER — Other Ambulatory Visit: Payer: Self-pay | Admitting: Family Medicine

## 2023-11-19 DIAGNOSIS — Z780 Asymptomatic menopausal state: Secondary | ICD-10-CM

## 2023-11-19 DIAGNOSIS — F17200 Nicotine dependence, unspecified, uncomplicated: Secondary | ICD-10-CM

## 2023-12-03 DIAGNOSIS — N390 Urinary tract infection, site not specified: Secondary | ICD-10-CM | POA: Diagnosis not present

## 2023-12-03 DIAGNOSIS — R300 Dysuria: Secondary | ICD-10-CM | POA: Diagnosis not present

## 2023-12-03 DIAGNOSIS — L299 Pruritus, unspecified: Secondary | ICD-10-CM | POA: Diagnosis not present

## 2024-01-19 DIAGNOSIS — H9203 Otalgia, bilateral: Secondary | ICD-10-CM | POA: Diagnosis not present

## 2024-01-19 DIAGNOSIS — J069 Acute upper respiratory infection, unspecified: Secondary | ICD-10-CM | POA: Diagnosis not present

## 2024-01-19 DIAGNOSIS — Z794 Long term (current) use of insulin: Secondary | ICD-10-CM | POA: Diagnosis not present

## 2024-01-19 DIAGNOSIS — E119 Type 2 diabetes mellitus without complications: Secondary | ICD-10-CM | POA: Diagnosis not present

## 2024-01-19 DIAGNOSIS — Z6828 Body mass index (BMI) 28.0-28.9, adult: Secondary | ICD-10-CM | POA: Diagnosis not present

## 2024-02-06 DIAGNOSIS — M5431 Sciatica, right side: Secondary | ICD-10-CM | POA: Diagnosis not present

## 2024-04-14 DIAGNOSIS — M5432 Sciatica, left side: Secondary | ICD-10-CM | POA: Diagnosis not present

## 2024-04-14 DIAGNOSIS — M25562 Pain in left knee: Secondary | ICD-10-CM | POA: Diagnosis not present

## 2024-04-14 DIAGNOSIS — M25561 Pain in right knee: Secondary | ICD-10-CM | POA: Diagnosis not present

## 2024-05-04 DIAGNOSIS — R300 Dysuria: Secondary | ICD-10-CM | POA: Diagnosis not present

## 2024-05-13 DIAGNOSIS — R300 Dysuria: Secondary | ICD-10-CM | POA: Diagnosis not present

## 2024-05-13 DIAGNOSIS — M79671 Pain in right foot: Secondary | ICD-10-CM | POA: Diagnosis not present

## 2024-05-23 ENCOUNTER — Telehealth: Payer: Self-pay | Admitting: *Deleted

## 2024-05-23 NOTE — Telephone Encounter (Signed)
 Pt is scheduled to see Dr. Chandra

## 2024-05-23 NOTE — Telephone Encounter (Signed)
 Copied from CRM [id number]. Topic: Appointments - Transfer of Care >> May 23, 2024  8:29 AM Berneda FALCON wrote: Pt is requesting to transfer FROM: Casey Barton Pt is requesting to transfer TO: [name] Reason for requested transfer: PCP retired It is the responsibility of the team the patient would like to transfer to (Dr. Chandra) to reach out to the patient if for any reason this transfer is not acceptable.

## 2024-06-04 DIAGNOSIS — M79672 Pain in left foot: Secondary | ICD-10-CM | POA: Diagnosis not present

## 2024-06-04 DIAGNOSIS — M79674 Pain in right toe(s): Secondary | ICD-10-CM | POA: Diagnosis not present

## 2024-06-04 DIAGNOSIS — I10 Essential (primary) hypertension: Secondary | ICD-10-CM | POA: Diagnosis not present

## 2024-06-04 DIAGNOSIS — Z6829 Body mass index (BMI) 29.0-29.9, adult: Secondary | ICD-10-CM | POA: Diagnosis not present

## 2024-06-05 ENCOUNTER — Ambulatory Visit (INDEPENDENT_AMBULATORY_CARE_PROVIDER_SITE_OTHER)

## 2024-06-05 ENCOUNTER — Ambulatory Visit: Admitting: Podiatry

## 2024-06-05 ENCOUNTER — Encounter: Payer: Self-pay | Admitting: Podiatry

## 2024-06-05 DIAGNOSIS — R600 Localized edema: Secondary | ICD-10-CM | POA: Diagnosis not present

## 2024-06-05 DIAGNOSIS — M109 Gout, unspecified: Secondary | ICD-10-CM | POA: Diagnosis not present

## 2024-06-05 DIAGNOSIS — M7752 Other enthesopathy of left foot: Secondary | ICD-10-CM | POA: Diagnosis not present

## 2024-06-05 DIAGNOSIS — M778 Other enthesopathies, not elsewhere classified: Secondary | ICD-10-CM

## 2024-06-05 DIAGNOSIS — R262 Difficulty in walking, not elsewhere classified: Secondary | ICD-10-CM

## 2024-06-05 MED ORDER — COLCHICINE 0.6 MG PO TABS: 0.6000 mg | ORAL_TABLET | Freq: Two times a day (BID) | ORAL | 0 refills | Status: DC

## 2024-06-05 NOTE — Progress Notes (Signed)
 Chief Complaint  Patient presents with   Foot Pain    Left foot pain, since yesterday hard to walk. Pain is on from the toes up the top of her foot, where laces would go. Bending her toes is excruciating.  A1c was 5.5 in Aug No anti coag.    HPI: 58 y.o. female presents today with concern of recent sudden onset of pain, redness and swelling to the dorsal aspect of the left midfoot.  This began yesterday.  She notes the pain is so severe that she can barely walk.  States that she is unable to bend her toes.  She went to [facility] urgent care and states that nothing was done.  States that x-rays were obtained and she was told they were negative for injury.  She does not have the films with her today for review.  Denies eating any foods rich in purines within 2 to 3 days of the onset of symptoms.  Denies trauma.  Denies seeing any bruising.  She does acknowledge she does not drink as much water as she should be on a daily basis.  Past Medical History:  Diagnosis Date   Allergy    Arthritis    Borderline diabetes 11/16/2015   No past surgical history on file. Allergies  Allergen Reactions   Amoxicillin -Pot Clavulanate Nausea And Vomiting   Clindamycin Nausea And Vomiting   Sertraline Other (See Comments)   Sulfa Antibiotics     SOB   Review of Systems  Musculoskeletal:  Positive for joint pain.  Skin:        Positive for calor and rubor to the dorsal foot     Physical Exam: Palpable pedal pulse.  Localized edema to the dorsal left midfoot.  There is erythema and calor also present to the dorsal left midfoot.  There is significant pain on palpation to the tarsometatarsal and midtarsal joints.  No open lesions are noted.  Interspaces are clear.  No leg edema is appreciated.  Epicritic sensation is intact.  No gaps or nodules within the extensor tendons are noted.  Radiographic Exam (left foot, 3 weightbearing views, 06/05/2024):  Normal osseous mineralization. No fractures  noted.  Assessment/Plan of Care: 1. Acute gout of left foot, unspecified cause   2. Capsulitis of left foot   3. Difficulty walking   4. Localized edema      Meds ordered this encounter  Medications   colchicine  0.6 MG tablet    Sig: Take 1 tablet (0.6 mg total) by mouth 2 (two) times daily. Take 2 pills by mouth for your first dose only.    Dispense:  20 tablet    Refill:  0   Informed the patient I am suspicious of acute gout attack to the dorsal left midfoot.  Will get her set up for serum uric acid and CBC with differential to evaluate gout versus infection.  Informed the patient that often the serum uric acid can be negative if the gout attack has been present for more than 48 hours.  It will normalize in the bloodstream once it is causing inflammation within the joint.  Encouraged patient to get these drawn today and she does not have to fast for this blood work.  Will go ahead and start her on colchicine  0.6 mg tablet 1 tablet p.o. twice daily until symptoms resolved.  She will take 2 tablets as her initial loading dose.  Will follow-up and see how the patient is doing with this.  Discussed risks  and possible side effects of taking the medication.  She is very aware of the possible GI side effects which can occur with prolonged use of the medication..  Informed the patient if her white cell count does come back elevated may also prescribe an antibiotic.  Patient to rest and elevate foot at home.  Limit weightbearing secondary to pain and inflammation.  Avoid ice and avoid heat.  Follow-up 1-2 weeks   Breda Bond DSABRA Imperial, DPM, FACFAS [facility]     [address] [place], [place] [address]                Office [phone]  Fax [phone]

## 2024-06-06 ENCOUNTER — Other Ambulatory Visit: Payer: Self-pay | Admitting: Podiatry

## 2024-06-06 ENCOUNTER — Ambulatory Visit: Payer: Self-pay | Admitting: Podiatry

## 2024-06-06 LAB — URIC ACID: Uric Acid: 4.3 mg/dL (ref 3.0–7.2)

## 2024-06-06 LAB — CBC WITH DIFFERENTIAL/PLATELET
Basophils Absolute: 0 x10E3/uL (ref 0.0–0.2)
Basos: 0 %
EOS (ABSOLUTE): 0.2 x10E3/uL (ref 0.0–0.4)
Eos: 2 %
Hematocrit: 45.4 % (ref 34.0–46.6)
Hemoglobin: 15.1 g/dL (ref 11.1–15.9)
Immature Grans (Abs): 0 x10E3/uL (ref 0.0–0.1)
Immature Granulocytes: 0 %
Lymphocytes Absolute: 3.3 x10E3/uL — ABNORMAL HIGH (ref 0.7–3.1)
Lymphs: 30 %
MCH: 32 pg (ref 26.6–33.0)
MCHC: 33.3 g/dL (ref 31.5–35.7)
MCV: 96 fL (ref 79–97)
Monocytes Absolute: 0.8 x10E3/uL (ref 0.1–0.9)
Monocytes: 7 %
Neutrophils Absolute: 6.6 x10E3/uL (ref 1.4–7.0)
Neutrophils: 61 %
Platelets: 235 x10E3/uL (ref 150–450)
RBC: 4.72 x10E6/uL (ref 3.77–5.28)
RDW: 12.1 % (ref 11.7–15.4)
WBC: 11 x10E3/uL — ABNORMAL HIGH (ref 3.4–10.8)

## 2024-06-06 MED ORDER — CEPHALEXIN 500 MG PO CAPS: 500.0000 mg | ORAL_CAPSULE | Freq: Three times a day (TID) | ORAL | 0 refills | Status: DC

## 2024-06-06 MED ORDER — CEPHALEXIN 500 MG PO CAPS: 500.0000 mg | ORAL_CAPSULE | Freq: Three times a day (TID) | ORAL | 0 refills | Status: AC

## 2024-06-06 NOTE — Telephone Encounter (Signed)
 Can this be ABT be routed to [facility] on file. She do not use [facility] anymore. She also  needs a call from nurse to explain what the Dr's message means. She do not use my chart. I read the message to her, but she needs to speak to someone in more detail on the clinical side.

## 2024-06-06 NOTE — Addendum Note (Signed)
 Addended by: HEROLD MOUND E on: 06/06/2024 12:31 PM   Modules accepted: Orders

## 2024-06-06 NOTE — Progress Notes (Signed)
 Spoke with patient changed medication to correct pharmacy.

## 2024-06-09 ENCOUNTER — Encounter: Payer: Self-pay | Admitting: Podiatry

## 2024-06-13 ENCOUNTER — Ambulatory Visit: Admitting: Podiatry

## 2024-06-13 DIAGNOSIS — E1142 Type 2 diabetes mellitus with diabetic polyneuropathy: Secondary | ICD-10-CM

## 2024-06-13 DIAGNOSIS — M109 Gout, unspecified: Secondary | ICD-10-CM

## 2024-06-13 DIAGNOSIS — M6289 Other specified disorders of muscle: Secondary | ICD-10-CM

## 2024-06-13 MED ORDER — COLCHICINE 0.6 MG PO TABS: 0.6000 mg | ORAL_TABLET | Freq: Two times a day (BID) | ORAL | 0 refills | Status: AC

## 2024-06-13 NOTE — Progress Notes (Signed)
 Chief Complaint  Patient presents with   Gout    FU for gout attack, left foot. Swelling is going down. She can tell a difference and is walking better. She is still having some pain on the lateral side. She asked about looking at both feet due to being diabetic, I let her know that would need to be a sep apt, due to time. She did state her white blood cell count was up and wanted to know if she needed to get bllod work done after taking ABX. A1c 5.5 in Aug No anti coag.     HPI: 58 y.o. female presents today for follow-up of gout attack left dorsal midfoot.  She states that she is 95% better since taking the colchicine .  She is having minimal pain near the dorsal lateral midfoot but the rest of the pain is resolved.  She denies any redness to the area now.  She did get her lab work completed that was ordered last visit.    She is also requesting a diabetic foot check today.  She gets pedicures regularly.  She notes that she had the bilateral great toenails removed in the past, and it was supposed to be permanent, but they grew back and now they grow slightly at an angle.  She states her pedicurist takes good care of it so that she does not have pain.  Past Medical History:  Diagnosis Date   Allergy    Arthritis    Borderline diabetes 11/16/2015   No past surgical history on file. Allergies  Allergen Reactions   Amoxicillin -Pot Clavulanate Nausea And Vomiting   Clindamycin Nausea And Vomiting   Sertraline Other (See Comments)   Sulfa Antibiotics     SOB    Physical Exam: Palpable pedal pulses.  Edema is 95 to 99% resolved in the left midfoot.  No calor is present.  Erythema has resolved.  Minimal pain on palpation near the fifth metatarsal-cuboid articulation.  Ankle dorsiflexion less than 10 degrees with the knee extended bilateral.  Protective sensation intact with a Semmes Weinstein monofilament bilateral.  Vibratory sensation is diminished to the first MPJ bilateral.  Temperature  sensation is intact bilateral.  Manual muscle testing 5/5 bilateral  Assessment/Plan of Care: 1. Acute gout of left foot, unspecified cause   2. Type 2 diabetes mellitus with peripheral neuropathy (HCC)   3. Tightness of both gastrocnemius muscles     Meds ordered this encounter  Medications   colchicine  0.6 MG tablet    Sig: Take 1 tablet (0.6 mg total) by mouth 2 (two) times daily. Take 2 pills by mouth for your first dose only.    Dispense:  20 tablet    Refill:  0   Reviewed findings with the patient today.  Although her uric acid was within normal limits, this could be a false negative if too much time had passed from the initial onset of symptoms.  She did question her elevated white cell count.  Upon review of her past white cell count, she seems to run just above normal when comparing her values from 2016 to present.  I do not feel this warrants additional antibiotics, especially since her symptoms have significantly improved.  Prescribed additional colchicine  if she has another gout attack.  She needs to discuss this with her primary care provider at her next so that her uric acid is checked regularly.  Patient given education on diabetic footcare as well as stretching exercises to perform daily for  her tight calf muscles.  Follow-up as needed  Awanda CHARM Imperial, DPM, FACFAS [facility]     [address] [place], [place] [address]                Office [phone]  Fax [phone]

## 2024-06-13 NOTE — Patient Instructions (Signed)

## 2024-06-24 DIAGNOSIS — M5441 Lumbago with sciatica, right side: Secondary | ICD-10-CM | POA: Diagnosis not present

## 2024-06-30 DIAGNOSIS — M5431 Sciatica, right side: Secondary | ICD-10-CM | POA: Diagnosis not present

## 2024-07-02 DIAGNOSIS — Z008 Encounter for other general examination: Secondary | ICD-10-CM | POA: Diagnosis not present

## 2024-07-07 DIAGNOSIS — M5431 Sciatica, right side: Secondary | ICD-10-CM | POA: Diagnosis not present

## 2024-07-10 DIAGNOSIS — M545 Low back pain, unspecified: Secondary | ICD-10-CM | POA: Diagnosis not present

## 2024-07-18 DIAGNOSIS — R300 Dysuria: Secondary | ICD-10-CM | POA: Diagnosis not present

## 2024-07-18 DIAGNOSIS — I10 Essential (primary) hypertension: Secondary | ICD-10-CM | POA: Diagnosis not present

## 2024-07-18 DIAGNOSIS — N3000 Acute cystitis without hematuria: Secondary | ICD-10-CM | POA: Diagnosis not present

## 2024-07-21 DIAGNOSIS — M8448XD Pathological fracture, other site, subsequent encounter for fracture with routine healing: Secondary | ICD-10-CM | POA: Diagnosis not present

## 2024-07-29 ENCOUNTER — Encounter: Payer: Self-pay | Admitting: Family Medicine

## 2024-07-29 ENCOUNTER — Ambulatory Visit: Admitting: Family Medicine

## 2024-07-29 VITALS — BP 155/90 | HR 91 | Ht 66.0 in | Wt 194.4 lb

## 2024-07-29 DIAGNOSIS — E119 Type 2 diabetes mellitus without complications: Secondary | ICD-10-CM

## 2024-07-29 DIAGNOSIS — Z1231 Encounter for screening mammogram for malignant neoplasm of breast: Secondary | ICD-10-CM

## 2024-07-29 DIAGNOSIS — Z23 Encounter for immunization: Secondary | ICD-10-CM | POA: Diagnosis not present

## 2024-07-29 DIAGNOSIS — M419 Scoliosis, unspecified: Secondary | ICD-10-CM

## 2024-07-29 DIAGNOSIS — R233 Spontaneous ecchymoses: Secondary | ICD-10-CM

## 2024-07-29 DIAGNOSIS — E1159 Type 2 diabetes mellitus with other circulatory complications: Secondary | ICD-10-CM | POA: Diagnosis not present

## 2024-07-29 DIAGNOSIS — I152 Hypertension secondary to endocrine disorders: Secondary | ICD-10-CM | POA: Diagnosis not present

## 2024-07-29 DIAGNOSIS — E785 Hyperlipidemia, unspecified: Secondary | ICD-10-CM | POA: Diagnosis not present

## 2024-07-29 DIAGNOSIS — Z7689 Persons encountering health services in other specified circumstances: Secondary | ICD-10-CM | POA: Diagnosis not present

## 2024-07-29 DIAGNOSIS — Z720 Tobacco use: Secondary | ICD-10-CM | POA: Insufficient documentation

## 2024-07-29 DIAGNOSIS — E1169 Type 2 diabetes mellitus with other specified complication: Secondary | ICD-10-CM

## 2024-07-29 DIAGNOSIS — Z0000 Encounter for general adult medical examination without abnormal findings: Secondary | ICD-10-CM | POA: Insufficient documentation

## 2024-07-29 MED ORDER — METFORMIN HCL ER 500 MG PO TB24: 1000.0000 mg | ORAL_TABLET | Freq: Every day | ORAL | 1 refills | Status: AC

## 2024-07-29 NOTE — Assessment & Plan Note (Signed)
-   Due for screening colonoscopy. Will continue with [facility] gastroenterology. Patient will schedule for next year. - Due for screening mammogram. Will send referral to [facility]. - Due for Pap smear. Discussed performing in-office at a future visit. - Discussed low-dose CT for lung cancer screening given smoking history. Patient will consider for next year due to cost concerns. - Influenza vaccine administered today.

## 2024-07-29 NOTE — Assessment & Plan Note (Signed)
-   Petechial rash of unknown etiology on bilateral arms. Not pruritic. Denies use of aspirin or other blood thinners. - R/o underlying causes. - Will check CBC with platelets with upcoming lab draw.

## 2024-07-29 NOTE — Assessment & Plan Note (Signed)
-   History of scoliosis diagnosed on MRI. Complains of left leg radicular pain for 3 days, consistent with sciatica. - Currently taking meloxicam and methocarbamol PRN with some effect. - Discussed conservative management including NSAIDs, heat, and exercises. - Discussed role of physical therapy. Patient has orthopedic follow-up this Friday with Dr. Carola. Ottie. Will await their recommendations for PT. - Can provide PT referral if not arranged by orthopedist.

## 2024-07-29 NOTE — Assessment & Plan Note (Signed)
 Not on a statin.  Will get records from [facility].  Will check lipid panel and discuss at next visit

## 2024-07-29 NOTE — Patient Instructions (Signed)
 It was nice to see you today,  We addressed the following topics today: - Please stop taking your current losartan pill. Start taking the new combination pill, losartan-hydrochlorothiazide, one time per day. - I would like you to stop taking the glipizide. - Please increase your metformin to two 500mg  tablets per day. You can take them both in the morning, or one in the morning and one in the evening if you experience stomach upset. - Please check your blood pressure at home for the next two weeks and bring the log back to the office so we can see if the new medication is working. - Please go for your blood work in the next week or two. - You have an appointment with your orthopedic specialist on Friday. Please discuss physical therapy options with them. If they do not arrange it, please let us  know and we can send a referral. - You can schedule your next follow-up appointment at the front desk for three months   Have a great day,  Rolan Slain, MD

## 2024-07-29 NOTE — Progress Notes (Signed)
 New Patient Office Visit  Subjective   Patient ID: Casey Barton, female    DOB: 02-02-1966  Age: 58 y.o. MRN: [id number]  CC:  Chief Complaint  Patient presents with   New Patient (Initial Visit)    HPI Casey Barton presents to establish care  Subjective - Establishing care. New patient to practice. Previous providers at [facility] [place]. - Type 2 Diabetes Mellitus. - Hypertension. - Hyperlipidemia. - Gout. - Scoliosis with associated sciatica. Diagnosed via MRI ordered by Dr. Ottie. History of insufficiency fracture of right pelvis, no history of fall. Previous steroid injections may have contributed to fracture per orthopedist. Right leg pain has resolved, now with left leg pain for 3 days. Describes pain as numbness, burning, or tingling that radiates down the back of the leg past the knee. Pain is manageable during the day without medication. - History of recurrent UTIs. - Petechial rash on both arms. Not itchy, but reports a crawling sensation. Also notes small bumps on arms. - Due for screening colonoscopy. Last one 5 years ago, recommended repeat in 5 years. - Due for Pap smear. Last one >5 years ago. No history of abnormal results.  Medications Colchicine  PRN (gout), glipizide daily (diabetes), losartan, metformin 500mg  daily (diabetes), meloxicam PRN at night (leg pain), methocarbamol PRN (leg pain, causes drowsiness), B12 supplement, multivitamin, Ocuvite, turmeric. Previously on Rybelsus with good effect (10 lb weight loss), but stopped due to cost. New prescription for losartan-hydrochlorothiazide 50/12.5 mg not yet started.  PMH, PSH, FH, Social Hx PMHx: Type 2 Diabetes Mellitus, Hypertension, Hyperlipidemia, Gout, Scoliosis, insufficiency fracture of right pelvis, recurrent UTIs. PSHx: Bilateral eye surgery for floaters 2 years ago, with improvement. FHx: Father with heart disease (MI at age 15/74) and diabetes. Paternal grandmother died of  MI at 69. Maternal grandfather died of MI at 69. Another grandfather died of leukemia. Social Hx: Smokes 1 pack/day or less for ~20 years. No alcohol or recreational drug use. Divorced. Has one son. Lives with her mother. 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  ROS Constitutional: Denies hypoglycemia symptoms like shakiness or weakness. Integumentary: Reports petechial rash on arms with crawling sensation and palpable bumps. Denies itching. MSK: Reports left leg pain with numbness, burning, and tingling.    Outpatient Encounter Medications as of 07/29/2024  Medication Sig   acetaminophen  (TYLENOL ) 500 MG tablet Take 1,000 mg by mouth every 6 (six) hours as needed.   albuterol  (PROVENTIL  HFA;VENTOLIN  HFA) 108 (90 Base) MCG/ACT inhaler Inhale 2 puffs into the lungs every 6 (six) hours as needed for wheezing or shortness of breath. (Patient not taking: Reported on 06/13/2024)   colchicine  0.6 MG tablet Take 1 tablet (0.6 mg total) by mouth 2 (two) times daily. Take 2 pills by mouth for your first dose only.   dicyclomine  (BENTYL ) 20 MG tablet Take 1 tablet (20 mg total) by mouth 2 (two) times daily.   ketorolac  (TORADOL ) 10 MG tablet Take 10 mg by mouth every 6 (six) hours as needed.   loratadine (CLARITIN) 10 MG tablet Take 10 mg by mouth daily.   losartan (COZAAR) 50 MG tablet Take 50 mg by mouth daily.   metFORMIN (GLUCOPHAGE-XR) 500 MG 24 hr tablet Take 2 tablets (1,000 mg total) by mouth daily with breakfast.   methocarbamol (ROBAXIN) 500 MG tablet Take 500 mg by mouth 3 (three) times daily.   ondansetron  (ZOFRAN  ODT) 4 MG disintegrating tablet Take 1 tablet (4 mg total) by mouth every  8 (eight) hours as needed for nausea or vomiting.   pantoprazole  (PROTONIX ) 20 MG tablet Take 2 tablets (40 mg total) by mouth 2 (two) times daily.   ranitidine  (ZANTAC ) 150 MG capsule Take 1 capsule (150 mg total) by mouth 2 (two) times daily.   sucralfate  (CARAFATE ) 1 g tablet Take 1 tablet (1 g total) by  mouth 4 (four) times daily -  with meals and at bedtime.   [DISCONTINUED] metFORMIN (GLUCOPHAGE-XR) 500 MG 24 hr tablet SMARTSIG:1 Tablet(s) By Mouth Every Evening   No facility-administered encounter medications on file as of 07/29/2024.    Past Medical History:  Diagnosis Date   Allergy    Arthritis    Borderline diabetes 11/16/2015    History reviewed. No pertinent surgical history.  Family History  Problem Relation Age of Onset   Heart disease Father    Kidney disease Father    Stroke Mother    Parkinson's disease Brother     Social History   Socioeconomic History   Marital status: Married    Spouse name: Not on file   Number of children: Not on file   Years of education: Not on file   Highest education level: Not on file  Occupational History   Not on file  Tobacco Use   Smoking status: Every Day   Smokeless tobacco: Not on file  Substance and Sexual Activity   Alcohol use: Yes    Alcohol/week: 1.0 standard drink of alcohol    Types: 1 Standard drinks or equivalent per week   Drug use: No   Sexual activity: Yes  Other Topics Concern   Not on file  Social History Narrative   Education: [employer].    Exercise daily for 30 minutes.   Works as  Teaching laboratory technician at [employer]   Divorced   1 son- lives in [place]   Enjoys going to concerts, swimming in the summer.    Social Drivers of Corporate Investment Banker Strain: Low Risk  (07/29/2024)   Overall Financial Resource Strain (CARDIA)    Difficulty of Paying Living Expenses: Not hard at all  Food Insecurity: No Food Insecurity (07/29/2024)   Hunger Vital Sign    Worried About Running Out of Food in the Last Year: Never true    Ran Out of Food in the Last Year: Never true  Transportation Needs: No Transportation Needs (07/29/2024)   PRAPARE - Administrator, Civil Service (Medical): No    Lack of Transportation (Non-Medical): No  Physical Activity: Sufficiently Active  (07/29/2024)   Exercise Vital Sign    Days of Exercise per Week: 5 days    Minutes of Exercise per Session: 70 min  Stress: No Stress Concern Present (07/29/2024)   [employer] of Occupational Health - Occupational Stress Questionnaire    Feeling of Stress: Not at all  Social Connections: Not on file  Intimate Partner Violence: Not on file    ROS     Objective   BP (!) 155/90   Pulse 91   Ht 5' 6 (1.676 m)   Wt 194 lb 6.4 oz (88.2 kg)   LMP 03/22/2013   SpO2 95%   BMI 31.38 kg/m   Physical Exam General: Alert and oriented. No acute distress. HEENT: PERRLA, EOMI. Oropharynx clear. Cv: rrr, no murmur Lungs: Clear to auscultation bilaterally. No wheeze or crackles.  Gi: soft, nbs.  Msk: strength equal b/l.  Slowed gait.   Ext: no pedal edema.  Psych: pleasant affect Skin: Petechial rash noted on both arms. Small palpable bumps noted. Photos taken for chart.     Assessment & Plan:   Encounter to establish care  Encounter for screening mammogram for malignant neoplasm of breast -     3D Screening Mammogram, Left and Right; Future  Flu vaccine need -     Flu vaccine trivalent PF, 6mos and older(Flulaval,Afluria,Fluarix,Fluzone)  Hyperlipidemia associated with type 2 diabetes mellitus (HCC) Assessment & Plan: Not on a statin.  Will get records from [facility].  Will check lipid panel and discuss at next visit  Orders: -     CBC with Differential/Platelet; Future -     Lipid panel; Future  Type 2 diabetes mellitus without complication, without long-term current use of insulin (HCC) Assessment & Plan: - Diagnosis established. Last A1c was 5.9 on metformin 500mg  daily and glipizide. Previously had success with Rybelsus but stopped due to cost. - Stop glipizide due to risk of hypoglycemia. - Increase metformin to 500mg  twice daily. Can take both in morning, or one in morning and one in evening if GI upset occurs. - Labs to be drawn in 1-2 weeks, including A1c. -  Follow up in 3 months to re-evaluate control.  Orders: -     Comprehensive metabolic panel with GFR; Future -     TSH; Future -     Hemoglobin A1c; Future -     Microalbumin / creatinine urine ratio; Future  Hypertension associated with diabetes [facility]) Assessment & Plan: - History of HTN. On losartan. New prescription for losartan-hydrochlorothiazide combination pill. BP elevated in office. - Stop losartan monotherapy. - Start losartan-hydrochlorothiazide 50-12.5mg  one tablet daily. - Check BPs at home for 2 weeks and bring log to office for review.   Scoliosis, unspecified scoliosis type, unspecified spinal region Assessment & Plan: - History of scoliosis diagnosed on MRI. Complains of left leg radicular pain for 3 days, consistent with sciatica. - Currently taking meloxicam and methocarbamol PRN with some effect. - Discussed conservative management including NSAIDs, heat, and exercises. - Discussed role of physical therapy. Patient has orthopedic follow-up this Friday with Dr. Carola. Ottie. Will await their recommendations for PT. - Can provide PT referral if not arranged by orthopedist.   Petechial rash Assessment & Plan: - Petechial rash of unknown etiology on bilateral arms. Not pruritic. Denies use of aspirin or other blood thinners. - R/o underlying causes. - Will check CBC with platelets with upcoming lab draw.  Orders: -     Comprehensive metabolic panel with GFR; Future -     CBC with Differential/Platelet; Future -     Protime-INR; Future -     APTT; Future  Healthcare maintenance Assessment & Plan: - Due for screening colonoscopy. Will continue with [facility] gastroenterology. Patient will schedule for next year. - Due for screening mammogram. Will send referral to [facility]. - Due for Pap smear. Discussed performing in-office at a future visit. - Discussed low-dose CT for lung cancer screening given smoking history. Patient will consider for next year  due to cost concerns. - Influenza vaccine administered today.   Tobacco abuse Assessment & Plan: 1 ppd for > 20 years.  Discussed low dose CT screening. Pt will consider it for next year.    Other orders -     metFORMIN HCl ER; Take 2 tablets (1,000 mg total) by mouth daily with breakfast.  Dispense: 180 tablet; Refill: 1   I spent 46 min n the mgmt of this  patient   Return in about 3 months (around 10/29/2024) for DM.   Toribio MARLA Slain, MD

## 2024-07-29 NOTE — Assessment & Plan Note (Signed)
 1 ppd for > 20 years.  Discussed low dose CT screening. Pt will consider it for next year.

## 2024-07-29 NOTE — Assessment & Plan Note (Signed)
-   Diagnosis established. Last A1c was 5.9 on metformin 500mg  daily and glipizide. Previously had success with Rybelsus but stopped due to cost. - Stop glipizide due to risk of hypoglycemia. - Increase metformin to 500mg  twice daily. Can take both in morning, or one in morning and one in evening if GI upset occurs. - Labs to be drawn in 1-2 weeks, including A1c. - Follow up in 3 months to re-evaluate control.

## 2024-07-29 NOTE — Assessment & Plan Note (Signed)
-   History of HTN. On losartan. New prescription for losartan-hydrochlorothiazide combination pill. BP elevated in office. - Stop losartan monotherapy. - Start losartan-hydrochlorothiazide 50-12.5mg  one tablet daily. - Check BPs at home for 2 weeks and bring log to office for review.

## 2024-08-01 DIAGNOSIS — M5416 Radiculopathy, lumbar region: Secondary | ICD-10-CM | POA: Diagnosis not present

## 2024-08-06 DIAGNOSIS — R21 Rash and other nonspecific skin eruption: Secondary | ICD-10-CM | POA: Diagnosis not present

## 2024-08-06 DIAGNOSIS — M7989 Other specified soft tissue disorders: Secondary | ICD-10-CM | POA: Diagnosis not present

## 2024-08-06 DIAGNOSIS — M5432 Sciatica, left side: Secondary | ICD-10-CM | POA: Diagnosis not present

## 2024-08-12 ENCOUNTER — Other Ambulatory Visit

## 2024-08-18 DIAGNOSIS — M5416 Radiculopathy, lumbar region: Secondary | ICD-10-CM | POA: Diagnosis not present

## 2024-10-22 ENCOUNTER — Other Ambulatory Visit

## 2024-10-29 ENCOUNTER — Ambulatory Visit: Admitting: Family Medicine

## 2025-06-12 ENCOUNTER — Ambulatory Visit: Admitting: Podiatry
